# Patient Record
Sex: Male | Born: 1965
Health system: Southern US, Community
[De-identification: ages and names within clinical notes are randomized; demographics above are authoritative.]

## PROBLEM LIST (undated history)

## (undated) DIAGNOSIS — K760 Fatty (change of) liver, not elsewhere classified: Secondary | ICD-10-CM

## (undated) DIAGNOSIS — Z720 Tobacco use: Secondary | ICD-10-CM

## (undated) DIAGNOSIS — F419 Anxiety disorder, unspecified: Secondary | ICD-10-CM

## (undated) DIAGNOSIS — K2970 Gastritis, unspecified, without bleeding: Secondary | ICD-10-CM

## (undated) DIAGNOSIS — M542 Cervicalgia: Secondary | ICD-10-CM

## (undated) DIAGNOSIS — I5189 Other ill-defined heart diseases: Secondary | ICD-10-CM

## (undated) DIAGNOSIS — R002 Palpitations: Secondary | ICD-10-CM

## (undated) DIAGNOSIS — K449 Diaphragmatic hernia without obstruction or gangrene: Secondary | ICD-10-CM

## (undated) DIAGNOSIS — I8390 Asymptomatic varicose veins of unspecified lower extremity: Secondary | ICD-10-CM

## (undated) DIAGNOSIS — R55 Syncope and collapse: Secondary | ICD-10-CM

## (undated) DIAGNOSIS — M5090 Cervical disc disorder, unspecified, unspecified cervical region: Secondary | ICD-10-CM

## (undated) DIAGNOSIS — K219 Gastro-esophageal reflux disease without esophagitis: Secondary | ICD-10-CM

## (undated) DIAGNOSIS — S42009A Fracture of unspecified part of unspecified clavicle, initial encounter for closed fracture: Secondary | ICD-10-CM

## (undated) HISTORY — DX: Fracture of unspecified part of unspecified clavicle, initial encounter for closed fracture: S42.009A

## (undated) HISTORY — DX: Asymptomatic varicose veins of unspecified lower extremity: I83.90

## (undated) HISTORY — PX: OTHER SURGICAL HISTORY: SHX169

## (undated) HISTORY — DX: Syncope and collapse: R55

## (undated) HISTORY — DX: Tobacco use: Z72.0

## (undated) HISTORY — DX: Palpitations: R00.2

## (undated) HISTORY — DX: Diaphragmatic hernia without obstruction or gangrene: K44.9

## (undated) HISTORY — DX: Cervicalgia: M54.2

## (undated) HISTORY — DX: Cervical disc disorder, unspecified, unspecified cervical region: M50.90

## (undated) HISTORY — DX: Anxiety disorder, unspecified: F41.9

## (undated) HISTORY — DX: Other ill-defined heart diseases: I51.89

## (undated) HISTORY — DX: Fatty (change of) liver, not elsewhere classified: K76.0

## (undated) HISTORY — DX: Gastritis, unspecified, without bleeding: K29.70

## (undated) HISTORY — DX: Gastro-esophageal reflux disease without esophagitis: K21.9

---

## 2005-12-05 ENCOUNTER — Encounter: Admission: RE | Admit: 2005-12-05 | Discharge: 2005-12-05 | Payer: Self-pay | Admitting: Neurosurgery

## 2005-12-07 ENCOUNTER — Encounter: Admission: RE | Admit: 2005-12-07 | Discharge: 2005-12-07 | Payer: Self-pay | Admitting: Neurosurgery

## 2006-10-01 ENCOUNTER — Ambulatory Visit: Payer: Self-pay | Admitting: Gastroenterology

## 2006-10-21 ENCOUNTER — Ambulatory Visit: Payer: Self-pay | Admitting: Gastroenterology

## 2006-10-21 ENCOUNTER — Encounter (INDEPENDENT_AMBULATORY_CARE_PROVIDER_SITE_OTHER): Payer: Self-pay | Admitting: Specialist

## 2006-11-05 ENCOUNTER — Ambulatory Visit: Payer: Self-pay | Admitting: Internal Medicine

## 2007-05-20 ENCOUNTER — Ambulatory Visit: Payer: Self-pay | Admitting: Internal Medicine

## 2007-05-20 LAB — CONVERTED CEMR LAB
AST: 17 units/L (ref 0–37)
Bilirubin, Direct: 0.1 mg/dL (ref 0.0–0.3)
CO2: 30 meq/L (ref 19–32)
Chloride: 109 meq/L (ref 96–112)
Creatinine, Ser: 1.2 mg/dL (ref 0.4–1.5)
Eosinophils Relative: 1.7 % (ref 0.0–5.0)
Glucose, Bld: 109 mg/dL — ABNORMAL HIGH (ref 70–99)
HCT: 44.6 % (ref 39.0–52.0)
Ketones, ur: NEGATIVE mg/dL
MCV: 94.9 fL (ref 78.0–100.0)
Neutrophils Relative %: 52.2 % (ref 43.0–77.0)
Nitrite: NEGATIVE
PSA: 1 ng/mL (ref 0.10–4.00)
RBC: 4.7 M/uL (ref 4.22–5.81)
RDW: 12.1 % (ref 11.5–14.6)
Sodium: 143 meq/L (ref 135–145)
Total Bilirubin: 0.6 mg/dL (ref 0.3–1.2)
Total CHOL/HDL Ratio: 5.6
Urobilinogen, UA: 0.2 (ref 0.0–1.0)
WBC: 5 10*3/uL (ref 4.5–10.5)
pH: 6 (ref 5.0–8.0)

## 2007-05-21 ENCOUNTER — Ambulatory Visit: Payer: Self-pay | Admitting: Internal Medicine

## 2007-08-14 ENCOUNTER — Ambulatory Visit: Payer: Self-pay | Admitting: Family Medicine

## 2007-09-08 ENCOUNTER — Ambulatory Visit: Payer: Self-pay | Admitting: Psychology

## 2007-09-17 ENCOUNTER — Ambulatory Visit: Payer: Self-pay | Admitting: Psychology

## 2007-09-22 ENCOUNTER — Telehealth (INDEPENDENT_AMBULATORY_CARE_PROVIDER_SITE_OTHER): Payer: Self-pay | Admitting: *Deleted

## 2007-11-15 DIAGNOSIS — I868 Varicose veins of other specified sites: Secondary | ICD-10-CM | POA: Insufficient documentation

## 2007-11-15 DIAGNOSIS — K219 Gastro-esophageal reflux disease without esophagitis: Secondary | ICD-10-CM | POA: Insufficient documentation

## 2007-11-15 DIAGNOSIS — M5030 Other cervical disc degeneration, unspecified cervical region: Secondary | ICD-10-CM | POA: Insufficient documentation

## 2007-11-15 DIAGNOSIS — F17200 Nicotine dependence, unspecified, uncomplicated: Secondary | ICD-10-CM | POA: Insufficient documentation

## 2007-11-15 DIAGNOSIS — K2270 Barrett's esophagus without dysplasia: Secondary | ICD-10-CM | POA: Insufficient documentation

## 2007-11-16 ENCOUNTER — Ambulatory Visit: Payer: Self-pay | Admitting: Internal Medicine

## 2007-11-16 DIAGNOSIS — J310 Chronic rhinitis: Secondary | ICD-10-CM | POA: Insufficient documentation

## 2007-11-16 DIAGNOSIS — F411 Generalized anxiety disorder: Secondary | ICD-10-CM | POA: Insufficient documentation

## 2007-12-08 ENCOUNTER — Telehealth: Payer: Self-pay | Admitting: Internal Medicine

## 2007-12-08 DIAGNOSIS — M79609 Pain in unspecified limb: Secondary | ICD-10-CM | POA: Insufficient documentation

## 2008-01-12 ENCOUNTER — Ambulatory Visit: Payer: Self-pay | Admitting: Internal Medicine

## 2008-04-24 DIAGNOSIS — S42009A Fracture of unspecified part of unspecified clavicle, initial encounter for closed fracture: Secondary | ICD-10-CM

## 2008-04-24 HISTORY — DX: Fracture of unspecified part of unspecified clavicle, initial encounter for closed fracture: S42.009A

## 2008-04-24 HISTORY — PX: FRACTURE SURGERY: SHX138

## 2008-05-13 ENCOUNTER — Emergency Department (HOSPITAL_BASED_OUTPATIENT_CLINIC_OR_DEPARTMENT_OTHER): Admission: EM | Admit: 2008-05-13 | Discharge: 2008-05-13 | Payer: Self-pay | Admitting: Emergency Medicine

## 2008-05-19 ENCOUNTER — Ambulatory Visit (HOSPITAL_COMMUNITY): Admission: RE | Admit: 2008-05-19 | Discharge: 2008-05-20 | Payer: Self-pay | Admitting: Orthopaedic Surgery

## 2008-08-29 ENCOUNTER — Ambulatory Visit: Payer: Self-pay | Admitting: Internal Medicine

## 2008-08-29 DIAGNOSIS — T8140XA Infection following a procedure, unspecified, initial encounter: Secondary | ICD-10-CM | POA: Insufficient documentation

## 2008-08-29 DIAGNOSIS — M25519 Pain in unspecified shoulder: Secondary | ICD-10-CM | POA: Insufficient documentation

## 2008-08-30 ENCOUNTER — Ambulatory Visit (HOSPITAL_BASED_OUTPATIENT_CLINIC_OR_DEPARTMENT_OTHER): Admission: RE | Admit: 2008-08-30 | Discharge: 2008-08-30 | Payer: Self-pay | Admitting: Internal Medicine

## 2008-08-30 ENCOUNTER — Telehealth: Payer: Self-pay | Admitting: Internal Medicine

## 2008-09-05 ENCOUNTER — Ambulatory Visit (HOSPITAL_COMMUNITY): Admission: RE | Admit: 2008-09-05 | Discharge: 2008-09-06 | Payer: Self-pay | Admitting: Orthopaedic Surgery

## 2008-10-05 ENCOUNTER — Ambulatory Visit (HOSPITAL_BASED_OUTPATIENT_CLINIC_OR_DEPARTMENT_OTHER): Admission: RE | Admit: 2008-10-05 | Discharge: 2008-10-05 | Payer: Self-pay | Admitting: Specialist

## 2008-12-12 ENCOUNTER — Telehealth: Payer: Self-pay | Admitting: Internal Medicine

## 2009-05-23 ENCOUNTER — Telehealth: Payer: Self-pay | Admitting: Internal Medicine

## 2009-09-27 ENCOUNTER — Ambulatory Visit: Payer: Self-pay | Admitting: Internal Medicine

## 2009-09-27 ENCOUNTER — Ambulatory Visit (HOSPITAL_BASED_OUTPATIENT_CLINIC_OR_DEPARTMENT_OTHER): Admission: RE | Admit: 2009-09-27 | Discharge: 2009-09-27 | Payer: Self-pay | Admitting: Internal Medicine

## 2009-09-27 ENCOUNTER — Ambulatory Visit: Payer: Self-pay | Admitting: Diagnostic Radiology

## 2009-09-27 DIAGNOSIS — R059 Cough, unspecified: Secondary | ICD-10-CM | POA: Insufficient documentation

## 2009-09-27 DIAGNOSIS — R05 Cough: Secondary | ICD-10-CM

## 2009-09-28 ENCOUNTER — Telehealth: Payer: Self-pay | Admitting: Internal Medicine

## 2009-10-12 ENCOUNTER — Ambulatory Visit: Payer: Self-pay | Admitting: Radiology

## 2009-10-12 ENCOUNTER — Telehealth: Payer: Self-pay | Admitting: Internal Medicine

## 2009-10-12 ENCOUNTER — Emergency Department (HOSPITAL_BASED_OUTPATIENT_CLINIC_OR_DEPARTMENT_OTHER): Admission: EM | Admit: 2009-10-12 | Discharge: 2009-10-12 | Payer: Self-pay | Admitting: Emergency Medicine

## 2009-10-25 ENCOUNTER — Ambulatory Visit: Payer: Self-pay | Admitting: Internal Medicine

## 2009-10-25 LAB — CONVERTED CEMR LAB
AST: 12 units/L (ref 0–37)
Albumin: 4.5 g/dL (ref 3.5–5.2)
Alkaline Phosphatase: 59 units/L (ref 39–117)
BUN: 20 mg/dL (ref 6–23)
Basophils Relative: 1 % (ref 0–1)
CO2: 26 meq/L (ref 19–32)
Calcium: 9.4 mg/dL (ref 8.4–10.5)
Creatinine, Ser: 1.12 mg/dL (ref 0.40–1.50)
Eosinophils Absolute: 0.1 10*3/uL (ref 0.0–0.7)
HDL: 39 mg/dL — ABNORMAL LOW (ref >39)
Indirect Bilirubin: 0.4 mg/dL (ref 0.0–0.9)
MCHC: 34.8 g/dL (ref 30.0–36.0)
MCV: 92.3 fL (ref 78.0–100.0)
Monocytes Relative: 7 % (ref 3–12)
Neutrophils Relative %: 43 % (ref 43–77)
Platelets: 327 10*3/uL (ref 150–400)
Total Bilirubin: 0.5 mg/dL (ref 0.3–1.2)

## 2009-11-09 ENCOUNTER — Ambulatory Visit: Payer: Self-pay | Admitting: Radiology

## 2009-11-09 ENCOUNTER — Ambulatory Visit: Payer: Self-pay | Admitting: Internal Medicine

## 2009-11-09 ENCOUNTER — Telehealth: Payer: Self-pay | Admitting: Internal Medicine

## 2009-11-09 ENCOUNTER — Ambulatory Visit (HOSPITAL_BASED_OUTPATIENT_CLINIC_OR_DEPARTMENT_OTHER): Admission: RE | Admit: 2009-11-09 | Discharge: 2009-11-09 | Payer: Self-pay | Admitting: Internal Medicine

## 2009-11-09 DIAGNOSIS — G4700 Insomnia, unspecified: Secondary | ICD-10-CM | POA: Insufficient documentation

## 2009-11-09 DIAGNOSIS — R112 Nausea with vomiting, unspecified: Secondary | ICD-10-CM | POA: Insufficient documentation

## 2009-11-09 LAB — CONVERTED CEMR LAB
Albumin: 4.2 g/dL (ref 3.5–5.2)
BUN: 13 mg/dL (ref 6–23)
Basophils Absolute: 0 10*3/uL (ref 0.0–0.1)
Basophils Relative: 1 % (ref 0–1)
CO2: 28 meq/L (ref 19–32)
Calcium: 9.2 mg/dL (ref 8.4–10.5)
Eosinophils Absolute: 0.2 10*3/uL (ref 0.0–0.7)
Eosinophils Relative: 3 % (ref 0–5)
Glucose, Bld: 97 mg/dL (ref 70–99)
HCT: 41.6 % (ref 39.0–52.0)
Hemoglobin: 15.2 g/dL (ref 13.0–17.0)
Lipase: 21 units/L (ref 0–75)
MCHC: 36.5 g/dL — ABNORMAL HIGH (ref 30.0–36.0)
MCV: 88.5 fL (ref 78.0–100.0)
Monocytes Absolute: 0.4 10*3/uL (ref 0.1–1.0)
Monocytes Relative: 7 % (ref 3–12)
RDW: 12.5 % (ref 11.5–15.5)
Total Bilirubin: 0.5 mg/dL (ref 0.3–1.2)
Total Protein: 6.7 g/dL (ref 6.0–8.3)

## 2009-11-12 ENCOUNTER — Telehealth: Payer: Self-pay | Admitting: Internal Medicine

## 2009-11-20 ENCOUNTER — Telehealth: Payer: Self-pay | Admitting: Internal Medicine

## 2009-11-26 ENCOUNTER — Telehealth: Payer: Self-pay | Admitting: Internal Medicine

## 2009-11-27 ENCOUNTER — Encounter (INDEPENDENT_AMBULATORY_CARE_PROVIDER_SITE_OTHER): Payer: Self-pay | Admitting: *Deleted

## 2009-12-11 ENCOUNTER — Telehealth: Payer: Self-pay | Admitting: Internal Medicine

## 2009-12-11 DIAGNOSIS — J329 Chronic sinusitis, unspecified: Secondary | ICD-10-CM | POA: Insufficient documentation

## 2009-12-12 ENCOUNTER — Encounter (INDEPENDENT_AMBULATORY_CARE_PROVIDER_SITE_OTHER): Payer: Self-pay | Admitting: *Deleted

## 2010-02-21 ENCOUNTER — Telehealth: Payer: Self-pay | Admitting: Internal Medicine

## 2010-03-14 ENCOUNTER — Telehealth: Payer: Self-pay | Admitting: Internal Medicine

## 2010-03-25 ENCOUNTER — Ambulatory Visit: Payer: Self-pay | Admitting: Gastroenterology

## 2010-04-16 ENCOUNTER — Encounter (INDEPENDENT_AMBULATORY_CARE_PROVIDER_SITE_OTHER): Payer: Self-pay | Admitting: *Deleted

## 2010-05-29 ENCOUNTER — Ambulatory Visit: Payer: Self-pay | Admitting: Family

## 2010-06-04 ENCOUNTER — Ambulatory Visit: Payer: Self-pay | Admitting: Internal Medicine

## 2010-06-05 ENCOUNTER — Ambulatory Visit: Payer: Self-pay | Admitting: Gastroenterology

## 2010-06-05 LAB — HM COLONOSCOPY

## 2010-06-13 ENCOUNTER — Telehealth: Payer: Self-pay | Admitting: Internal Medicine

## 2010-07-30 ENCOUNTER — Telehealth: Payer: Self-pay | Admitting: Internal Medicine

## 2010-10-16 ENCOUNTER — Ambulatory Visit: Payer: Self-pay | Admitting: Internal Medicine

## 2010-10-16 DIAGNOSIS — L0201 Cutaneous abscess of face: Secondary | ICD-10-CM | POA: Insufficient documentation

## 2010-10-16 DIAGNOSIS — L03211 Cellulitis of face: Secondary | ICD-10-CM | POA: Insufficient documentation

## 2010-11-15 ENCOUNTER — Ambulatory Visit: Payer: Self-pay | Admitting: Internal Medicine

## 2010-11-20 ENCOUNTER — Ambulatory Visit (HOSPITAL_BASED_OUTPATIENT_CLINIC_OR_DEPARTMENT_OTHER)
Admission: RE | Admit: 2010-11-20 | Discharge: 2010-11-20 | Payer: Self-pay | Source: Home / Self Care | Attending: Internal Medicine | Admitting: Internal Medicine

## 2010-11-20 ENCOUNTER — Ambulatory Visit
Admission: RE | Admit: 2010-11-20 | Discharge: 2010-11-20 | Payer: Self-pay | Source: Home / Self Care | Attending: Family | Admitting: Family

## 2010-12-07 ENCOUNTER — Emergency Department (HOSPITAL_BASED_OUTPATIENT_CLINIC_OR_DEPARTMENT_OTHER)
Admission: EM | Admit: 2010-12-07 | Discharge: 2010-12-07 | Payer: Self-pay | Source: Home / Self Care | Admitting: Emergency Medicine

## 2010-12-09 ENCOUNTER — Ambulatory Visit (HOSPITAL_BASED_OUTPATIENT_CLINIC_OR_DEPARTMENT_OTHER)
Admission: RE | Admit: 2010-12-09 | Discharge: 2010-12-09 | Payer: Self-pay | Source: Home / Self Care | Attending: Internal Medicine | Admitting: Internal Medicine

## 2010-12-09 ENCOUNTER — Telehealth: Payer: Self-pay | Admitting: Internal Medicine

## 2010-12-09 ENCOUNTER — Encounter: Payer: Self-pay | Admitting: Internal Medicine

## 2010-12-09 ENCOUNTER — Ambulatory Visit
Admission: RE | Admit: 2010-12-09 | Discharge: 2010-12-09 | Payer: Self-pay | Source: Home / Self Care | Attending: Internal Medicine | Admitting: Internal Medicine

## 2010-12-09 DIAGNOSIS — R079 Chest pain, unspecified: Secondary | ICD-10-CM | POA: Insufficient documentation

## 2010-12-09 DIAGNOSIS — I10 Essential (primary) hypertension: Secondary | ICD-10-CM | POA: Insufficient documentation

## 2010-12-09 LAB — DIFFERENTIAL
Basophils Absolute: 0 10*3/uL (ref 0.0–0.1)
Basophils Relative: 0 % (ref 0–1)
Eosinophils Absolute: 0.1 10*3/uL (ref 0.0–0.7)
Eosinophils Relative: 2 % (ref 0–5)
Lymphocytes Relative: 39 % (ref 12–46)
Lymphs Abs: 2.2 10*3/uL (ref 0.7–4.0)
Monocytes Absolute: 0.4 10*3/uL (ref 0.1–1.0)
Monocytes Relative: 7 % (ref 3–12)
Neutro Abs: 2.9 10*3/uL (ref 1.7–7.7)
Neutrophils Relative %: 52 % (ref 43–77)

## 2010-12-09 LAB — BASIC METABOLIC PANEL
BUN: 18 mg/dL (ref 6–23)
CO2: 23 mEq/L (ref 19–32)
Calcium: 9.3 mg/dL (ref 8.4–10.5)
Chloride: 110 mEq/L (ref 96–112)
Creatinine, Ser: 1 mg/dL (ref 0.4–1.5)
GFR calc Af Amer: >60 mL/min (ref >60)
GFR calc non Af Amer: >60 mL/min (ref >60)
Glucose, Bld: 145 mg/dL — ABNORMAL HIGH (ref 70–99)
Potassium: 3.9 mEq/L (ref 3.5–5.1)
Sodium: 146 mEq/L — ABNORMAL HIGH (ref 135–145)

## 2010-12-09 LAB — CBC
HCT: 40.5 % (ref 39.0–52.0)
Hemoglobin: 14.9 g/dL (ref 13.0–17.0)
MCH: 32.5 pg (ref 26.0–34.0)
MCHC: 36.8 g/dL — ABNORMAL HIGH (ref 30.0–36.0)
MCV: 88.2 fL (ref 78.0–100.0)
Platelets: 284 10*3/uL (ref 150–400)
RBC: 4.59 MIL/uL (ref 4.22–5.81)
RDW: 12.9 % (ref 11.5–15.5)
WBC: 5.7 10*3/uL (ref 4.0–10.5)

## 2010-12-09 LAB — URINALYSIS, ROUTINE W REFLEX MICROSCOPIC
Bilirubin Urine: NEGATIVE
Hgb urine dipstick: NEGATIVE
Ketones, ur: NEGATIVE mg/dL
Nitrite: NEGATIVE
Protein, ur: NEGATIVE mg/dL
Specific Gravity, Urine: 1.023 (ref 1.005–1.030)
Urine Glucose, Fasting: NEGATIVE mg/dL
Urobilinogen, UA: 1 mg/dL (ref 0.0–1.0)
pH: 6.5 (ref 5.0–8.0)

## 2010-12-09 LAB — POCT CARDIAC MARKERS
CKMB, poc: <1 ng/mL — ABNORMAL LOW (ref 1.0–8.0)
CKMB, poc: <1 ng/mL — ABNORMAL LOW (ref 1.0–8.0)
Myoglobin, poc: 41.8 ng/mL (ref 12–200)
Myoglobin, poc: 33.8 ng/mL (ref 12–200)
Troponin i, poc: <0.05 ng/mL (ref 0.00–0.09)
Troponin i, poc: <0.05 ng/mL (ref 0.00–0.09)

## 2010-12-09 LAB — POCT TOXICOLOGY PANEL: Tetrahydrocannabinol: POSITIVE

## 2010-12-09 LAB — HEPATIC FUNCTION PANEL
ALT: 31 U/L (ref 0–53)
AST: 40 U/L — ABNORMAL HIGH (ref 0–37)
Albumin: 4.1 g/dL (ref 3.5–5.2)
Alkaline Phosphatase: 80 U/L (ref 39–117)
Bilirubin, Direct: 0 mg/dL (ref 0.0–0.3)
Indirect Bilirubin: 0.8 mg/dL (ref 0.3–0.9)
Total Bilirubin: 0.8 mg/dL (ref 0.3–1.2)
Total Protein: 7.1 g/dL (ref 6.0–8.3)

## 2010-12-09 LAB — LIPASE, BLOOD: Lipase: 100 U/L (ref 23–300)

## 2010-12-10 ENCOUNTER — Telehealth: Payer: Self-pay | Admitting: Internal Medicine

## 2010-12-11 ENCOUNTER — Ambulatory Visit
Admission: RE | Admit: 2010-12-11 | Discharge: 2010-12-11 | Payer: Self-pay | Source: Home / Self Care | Attending: Cardiology | Admitting: Cardiology

## 2010-12-11 DIAGNOSIS — R002 Palpitations: Secondary | ICD-10-CM | POA: Insufficient documentation

## 2010-12-12 ENCOUNTER — Telehealth: Payer: Self-pay | Admitting: Internal Medicine

## 2010-12-13 ENCOUNTER — Ambulatory Visit
Admission: RE | Admit: 2010-12-13 | Discharge: 2010-12-13 | Payer: Self-pay | Source: Home / Self Care | Attending: Internal Medicine | Admitting: Internal Medicine

## 2010-12-17 ENCOUNTER — Ambulatory Visit: Admission: RE | Admit: 2010-12-17 | Discharge: 2010-12-17 | Payer: Self-pay | Source: Home / Self Care

## 2010-12-17 ENCOUNTER — Ambulatory Visit (HOSPITAL_COMMUNITY)
Admission: RE | Admit: 2010-12-17 | Discharge: 2010-12-17 | Payer: Self-pay | Source: Home / Self Care | Attending: Cardiology | Admitting: Cardiology

## 2010-12-17 ENCOUNTER — Encounter: Payer: Self-pay | Admitting: Cardiology

## 2010-12-17 DIAGNOSIS — I5189 Other ill-defined heart diseases: Secondary | ICD-10-CM

## 2010-12-17 HISTORY — DX: Other ill-defined heart diseases: I51.89

## 2010-12-20 ENCOUNTER — Telehealth: Payer: Self-pay | Admitting: Internal Medicine

## 2010-12-24 ENCOUNTER — Ambulatory Visit
Admission: RE | Admit: 2010-12-24 | Discharge: 2010-12-24 | Payer: Self-pay | Source: Home / Self Care | Attending: Gastroenterology | Admitting: Gastroenterology

## 2010-12-24 ENCOUNTER — Encounter: Payer: Self-pay | Admitting: Gastroenterology

## 2010-12-24 DIAGNOSIS — R1319 Other dysphagia: Secondary | ICD-10-CM | POA: Insufficient documentation

## 2010-12-24 NOTE — Assessment & Plan Note (Signed)
Summary: mrsa?/mhf   Vital Signs:  Patient profile:   45 year old male Height:      75.5 inches Weight:      256.50 pounds BMI:     31.75 O2 Sat:      98 % on Room air Temp:     97.7 degrees F oral Resp:     18 per minute BP sitting:   122 / 90  (left arm) Cuff size:   large  Vitals Entered By: Glendell Docker CMA (October 16, 2010 9:06 AM)  O2 Flow:  Room air CC: Facial Pain Is Patient Diabetic? No   Primary Care Provider:  Thomos Lemons, DO  CC:  Facial Pain.  History of Present Illness: Raymond Mathews, Raymond Mathews  Preventive Screening-Counseling & Management  Alcohol-Tobacco     Smoking Status: quit  Allergies (verified): No Known Drug Allergies  Past History:  Past Medical History: GERD History of neck pain -  History of tobacco abuse    Hx of accident 06/09 - left comminuted clavicle fracture (complicated by wound infection)  Cervical Disc Disease Varicose Veins Hiatal Hernia Gastritis  Past Surgical History: Open reduction internal fixation of left clavicle fracture using Katrinka Blazing Raymond Matthew anatomic clavicle plate.  -  30/8657    Family History: Mother is 76 years old with uterine cancer or thyroid disorder Father deceased at age Raymond secondary to his stroke Grandmother with diabetes     Family History of Diabetes: Grandparents Family History of Heart Disease: Grandparents     Social History: Single  has two boys Occupation:  Self-employed in Chief Operating Officer Former Smoker quit in 2001 Alcohol use-no  Drug use-no  Poor diet - fast food  Daily Caffeine Use-coffee    Physical Exam  General:  alert, well-developed, Raymond well-nourished.   Mathews:  left nares red Raymond swollen,  no pus,  no fluctuance   Impression & Recommendations:  Problem # 1:  CELLULITIS Raymond ABSCESS OF FACE (ICD-682.0)  The  following medications were removed from the medication list:    Smz-tmp Ds 800-160 Mg Tabs (Sulfamethoxazole-trimethoprim) ..... One by mouth two times a day    Clindamycin Hcl 150 Mg Caps (Clindamycin hcl) ..... One by mouth three times a day His updated medication list for this problem includes:    Sulfamethoxazole-tmp Ds 800-160 Mg Tabs (Sulfamethoxazole-trimethoprim) ..... One by mouth two times a day    Clindamycin Hcl 150 Mg Caps (Clindamycin hcl) ..... One by mouth three times a day   Warm moist compresses for 20 minutes every 2 hours while awake. Take antibiotics as directed Raymond take acetaminophen as needed. To be seen in 48-72 hours if no improvement, sooner if worse.  Complete Medication List: 1)  Prevacid 24hr 15 Mg Cpdr (Lansoprazole) .... Take 1 capsule by mouth once a day 2)  Sulfamethoxazole-tmp Ds 800-160 Mg Tabs (Sulfamethoxazole-trimethoprim) .... One by mouth two times a day 3)  Clindamycin Hcl 150 Mg Caps (Clindamycin hcl) .... One by mouth three times a day  Patient Instructions: 1)  Call our office if your symptoms do not  improve or gets worse.  Contraindications/Deferment of Procedures/Staging:    Test/Procedure: FLU VAX    Reason for deferment: patient declined  Prescriptions: CLINDAMYCIN HCL 150 MG CAPS (CLINDAMYCIN HCL) one by mouth three times a  day  #30 x 0   Entered Raymond Authorized by:   D. Thomos Lemons DO   Signed by:   D. Thomos Lemons DO on 10/16/2010   Method used:   [id number]       [address]       [place], [place]  [address]       Ph: [phone] or [phone]       Fax: [phone]   RxID:   [id number] SULFAMETHOXAZOLE-TMP DS 800-160 MG TABS (SULFAMETHOXAZOLE-TRIMETHOPRIM) one by mouth two times a day  #20 x 0   Entered Raymond Authorized by:   D. Thomos Lemons DO   Signed by:   D. Thomos Lemons DO on 10/16/2010   Method used:   [facility]        [id number]       [address]       [place], [place]  [address]       Ph: [phone] or [phone]       Fax: [phone]   RxID:   [id number]    Orders Added: 1)  Est. Patient Level III [[address]]   Immunization History:  Influenza Immunization History:    Influenza:  declined (10/16/2010)   Immunization History:  Influenza Immunization History:    Influenza:  Declined (10/16/2010)  Current Allergies (reviewed today): No known allergies

## 2010-12-24 NOTE — Assessment & Plan Note (Signed)
Summary: lump on neck/mhf   Vital Signs:  Patient profile:   45 year old male Height:      75.5 inches Weight:      243.50 pounds BMI:     30.14 O2 Sat:      98 % on Room air Temp:     97.8 degrees F oral Pulse rate:   99 / minute Pulse rhythm:   regular Resp:     16 per minute BP sitting:   110 / 80  (right arm) Cuff size:   large  Vitals Entered By: Glendell Docker CMA (May 29, 2010 1:39 PM)  O2 Flow:  Room air CC: Rm- 4 Evaluation of lump on neck Comments onset Sunday of of bump on left, after he attempted to squeeze it , it became bigger. He states that it hurts constantly and he has pain in his shoulder area   Primary Care Provider:  D. Robert Yoo DO  CC:  Rm- 4 Evaluation of lump on neck.  History of Present Illness: Raymond Mathews is a 45 year old male who presents with complaint of "lump on my neck."  He notes that this area initially started as a pimple which he "picked at."  Now has become red, swollen, and painful.  He has tried ice and cold compresses which have not improved his symptoms.  Wife is currently being treated for a cellulitis of her finger.  He notes that he has been doing some yard work recently.  Allergies: No Known Drug Allergies  Physical Exam  General:  Well-developed,well-nourished,in no acute distress; alert,appropriate and cooperative throughout examination Head:  Normocephalic and atraumatic without obvious abnormalities. No apparent alopecia or balding. Neck:  approximately 1inch wide area of induration noted on base of left neck with scabbed center- no drainage.  Tender to touch with some extension of erythema beneath the area of induration   Impression & Recommendations:  Problem # 1:  CELLULITIS AND ABSCESS OF NECK (ICD-682.1) Assessment New Will plan to treat with doxycycline.  No fluctuance noted- do not suspect that I and D would yield much drainage at this point.  Plan to have patient f/u in 48 hours- sooner if increased pain, redness,  swelling- patient verbalizes understanding. His updated medication list for this problem includes:    Doxycycline Hyclate 100 Mg Caps (Doxycycline hyclate) ..... One tablet by mouth two times a day x 10 days  Complete Medication List: 1)  Omeprazole-sodium Bicarbonate 40-1100 Mg Caps (Omeprazole-sodium bicarbonate) .... Take 1 capsule by mouth once a day (office visit needed for future refills) 2)  Doxycycline Hyclate 100 Mg Caps (Doxycycline hyclate) .... One tablet by mouth two times a day x 10 days  Patient Instructions: 1)  Call if you develop increased pain,  swelling of neck. 2)  Follow up in Friday. Prescriptions: DOXYCYCLINE HYCLATE 100 MG CAPS (DOXYCYCLINE HYCLATE) one tablet by mouth two times a day x 10 days  #20 x 0   Entered and Authorized by:   Fallon Haecker S O\'Sullivan FNP   Signed by:   Raysha Tilmon S O\'Sullivan FNP on 05/29/2010   Method used:   [facility]        [id number]       [address]       [place], [place]  [address]       Ph: [phone] or [phone]       Fax: [phone]  RxID:   [id number]

## 2010-12-24 NOTE — Letter (Signed)
Summary: New Patient letter  [facility]  [address] [place], [place] [address]   Phone: [phone]  Fax: [phone]       11/27/2009 MRN: [id number]  Raymond Mathews [address] [place], [place]  [address]  Dear Mr. Alberty,  Welcome to the Gastroenterology Division at [facility].    You are scheduled to see Dr. Russella Dar on 12-24-09 at 10:00a.m. on the 3rd floor at [facility], [address].  We ask that you try to arrive at our office 15 minutes prior to your appointment time to allow for check-in.  We would like you to complete the enclosed self-administered evaluation form prior to your visit and bring it with you on the day of your appointment.  We will review it with you.  Also, please bring a complete list of all your medications or, if you prefer, bring the medication bottles and we will list them.  Please bring your insurance card so that we may make a copy of it.  If your insurance requires a referral to see a specialist, please bring your referral form from your primary care physician.  Co-payments are due at the time of your visit and may be paid by cash, check or credit card.     Your office visit will consist of a consult with your physician (includes a physical exam), any laboratory testing he/she may order, scheduling of any necessary diagnostic testing (e.g. x-ray, ultrasound, CT-scan), and scheduling of a procedure (e.g. Endoscopy, Colonoscopy) if required.  Please allow enough time on your schedule to allow for any/all of these possibilities.    If you cannot keep your appointment, please call [phone] to cancel or reschedule prior to your appointment date.  This allows [place] the opportunity to schedule an appointment for another patient in need of care.  If you do not cancel or reschedule by 5 p.m. the business day prior to your appointment date, you will be charged a $50.00 late cancellation/no-show fee.    Thank you for choosing  [facility] for your medical needs.  We appreciate the opportunity to care for you.  Please visit [place] at our website  to learn more about our practice.                     Sincerely,                                                             The Gastroenterology Division

## 2010-12-24 NOTE — Assessment & Plan Note (Signed)
Summary: vomiting/nauseated--ch.   History of Present Illness Visit Type: consult Primary GI MD: Elie Goody MD [facility] Primary Provider: Thomos Lemons, DO Requesting Provider:  Thomos Lemons, DO Chief Complaint: GERD, pt no longer has nausea and vomiting History of Present Illness:   Patient is a 45 year old male last seen by Dr. Russella Dar in 2007 for history of GERD.  PCP recently gave him a prescription for Nexium but prior to that he had been taking daily Prevacid OTC with suboptimal control of GERD symptoms. Feels some of the ongoing heartburn / regurgitation is his fault because of only partial compliance with anti-reflux measures. No dysphagia. No other GI complaints.   On Clindamycin for neck cellulitis. No diarrhea.    GI Review of Systems    Reports acid reflux, belching, bloating, chest pain, and  heartburn.      Denies abdominal pain, dysphagia with liquids, dysphagia with solids, loss of appetite, nausea, vomiting, vomiting blood, weight loss, and  weight gain.        Denies anal fissure, black tarry stools, change in bowel habit, constipation, diarrhea, diverticulosis, fecal incontinence, heme positive stool, hemorrhoids, irritable bowel syndrome, jaundice, light color stool, liver problems, rectal bleeding, and  rectal pain.   Current Medications (verified): 1)  Smz-Tmp Ds 800-160 Mg Tabs (Sulfamethoxazole-Trimethoprim) .... One By Mouth Two Times A Day 2)  Clindamycin Hcl 150 Mg Caps (Clindamycin Hcl) .... One By Mouth Three Times A Day 3)  Prevacid 15 Mg Cpdr (Lansoprazole) .... One Tablet By Mouth Once Daily  Allergies (verified): No Known Drug Allergies  Past History:  Past Medical History: GERD History of neck pain -  History of tobacco abuse   Hx of accident 06/09 - left comminuted clavicle fracture (complicated by wound infection)  Cervical Disc Disease Varicose Veins Hiatal Hernia Gastritis  Past Surgical History: Open reduction internal fixation of left  clavicle fracture using Katrinka Blazing and Matthew anatomic clavicle plate.  -  16/1096   Family History: Reviewed history from 12/20/2009 and no changes required. Mother is 29 years old with uterine cancer or thyroid disorder Father deceased at age 75 secondary to his stroke Grandmother with diabetes     Family History of Diabetes: Grandparents Family History of Heart Disease: Grandparents  Social History: Single  has two boys Occupation:  Self-employed in Chief Operating Officer Former Smoker quit in 2001 Alcohol use-no  Drug use-no  Poor diet - fast food  Daily Caffeine Use-coffee  Review of Systems       The patient complains of anxiety-new.  The patient denies allergy/sinus, anemia, arthritis/joint pain, back pain, blood in urine, breast changes/lumps, change in vision, confusion, cough, coughing up blood, depression-new, fainting, fatigue, fever, headaches-new, hearing problems, heart murmur, heart rhythm changes, itching, menstrual pain, muscle pains/cramps, night sweats, nosebleeds, pregnancy symptoms, shortness of breath, skin rash, sleeping problems, sore throat, swelling of feet/legs, swollen lymph glands, thirst - excessive , urination - excessive , urination changes/pain, urine leakage, vision changes, and voice change.    Vital Signs:  Patient profile:   45 year old male Height:      75.5 inches Weight:      246 pounds BMI:     30.45 Pulse rate:   80 / minute Pulse rhythm:   regular BP sitting:   118 / 78  (left arm) Cuff size:   large  Vitals Entered By: Francee Piccolo CMA Duncan Dull) (June 05, 2010 9:29 AM)  Physical Exam  General:  Well developed, well nourished, no  acute distress. Head:  Normocephalic and atraumatic. Eyes:  PERRLA, no icterus. Ears:  Normal auditory acuity. Lungs:  Clear throughout to auscultation. Heart:  Regular rate and rhythm; no murmurs, rubs,  or bruits. Abdomen:  Abdomen soft, nontender, nondistended. No obvious masses or hepatomegaly.Normal  bowel sounds.  Msk:  Symmetrical with no gross deformities. Normal posture. Neurologic:  Alert and  oriented x4;  grossly normal neurologically. Psych:  Alert and cooperative. Normal mood and affect.  Impression & Recommendations:  Problem # 1:  GASTROESOPHAGEAL REFLUX DISEASE (ICD-530.81) Suboptimal control of GERD symptoms (heartburn, regurgitation) on Prevacid OTC. Back on Nexium, needs refill. Reiterated anti-reflux measures. Irregular z-line on EGD in 2007 - biopsies were negative for Barretts.   Problem # 2:  SCREENING COLORECTAL-CANCER (ICD-V76.51) Screening colonoscopy at age 40 in 03/2017.  Patient Instructions: 1)  Discontinue Prevacid OTC and a refill for Nexium has been sent to your pharmacy.  2)  Avoid foods high in acid content ( tomatoes, citrus juices, spicy foods) . Avoid eating within 3 to 4 hours of lying down or before exercising. Do not over eat; try smaller more frequent meals. Elevate head of bed four inches when sleeping.  3)  Please schedule a follow-up appointment in 1 year. 4)  Copy sent to : Thomos Lemons, DO 5)  The medication list was reviewed and reconciled.  All changed / newly prescribed medications were explained.  A complete medication list was provided to the patient / caregiver.  Prescriptions: NEXIUM 40 MG CPDR (ESOMEPRAZOLE MAGNESIUM) one tablet by mouth once daily  #30 x 11   Entered by:   Christie Nottingham CMA (AAMA)   Authorized by:   Meryl Dare MD [facility]   Signed by:   Christie Nottingham CMA (AAMA) on 06/05/2010   Method used:   [facility]        [phone]       [address]       [place], [place]  [address]       Ph: [phone] or [phone]       Fax: [phone]   RxID:   [id number]

## 2010-12-24 NOTE — Assessment & Plan Note (Signed)
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 Thomos Lemons DO  CC:  Rm - 2  Evaluation of lump on neck.  History of Present Illness: 45 y/o  white male for follow up: pt prev seen by Sandford Craze  " who presents with complaint of "lump on my neck."  He notes that this area initially started as a pimple which he "picked at."  Now has become red, swollen, and painful.  He has tried ice and cold compresses which have not improved his symptoms.  Wife is currently being treated for a cellulitis of her finger.  He notes that he has been doing some yard work recently."  area has been draining pus.  better since using doxycycline but not completely resolved. no fever or chills  Allergies (verified): No Known Drug Allergies  Past History:  Past Medical History: GERD History of neck pain -  History of tobacco abuse   Hx of accident 06/09 - left comminuted clav icle fracture (complicated by wound infection)  Cervical Disc Disease Varicose Veins Hiatal Hernia Gastritis  Family History: Mother is 78 years old with uterine cancer or thyroid disorder Father deceased at age 22 secondary to his stroke Grandmother with diabetes     Family History of Diabetes: Grandparents Family History of Heart Disease: Grandparents    Social History: Single  has two children Occupation:  Self-employed in Insurance underwriter Former Smoker quit in 2001  Alcohol use-no  Drug use-no  Poor diet - fast food   Physical Exam  General:  alert, well-developed, and well-nourished.   Neck:  2x 4 cm oblong area of inderation left lower neck.  minimal drainage.  mild tenderness.  mild redness Lungs:  normal respiratory effort and normal breath sounds.   Heart:  normal rate, regular rhythm, and no gallop.     Impression & Recommendations:  Problem # 1:  CELLULITIS AND ABSCESS OF NECK (ICD-682.1) Assessment Improved  improved but not resolved.  after obtaining concent.  area prepped with betadyne.  small 1cm incision made for I & D.  small amt of pus drained.  switch abx to combination of bactrim and clinda.  Patient advised to call office if symptoms persist or worsen.  The following medications were removed from the medication list:    Doxycycline Hyclate 100 Mg Caps (Doxycycline hyclate) ..... One tablet by mouth two times a day x 10 days His updated medication list for this problem includes:    Smz-tmp Ds 800-160 Mg Tabs (Sulfamethoxazole-trimethoprim) ..... One by mouth two times a day    Clindamycin Hcl 150 Mg Caps (Clindamycin hcl) ..... One by mouth three times a day  Orders: I&D  Abscess, Simple / Single ([address])  Complete Medication List: 1)  Smz-tmp Ds 800-160 Mg Tabs (Sulfamethoxazole-trimethoprim) .... One by mouth two times a day 2)  Clindamycin Hcl 150 Mg Caps (Clindamycin hcl) .... One by mouth three times a day 3)  Nexium 40 Mg Cpdr (Esomeprazole magnesium) .... One tablet by mouth once daily  Patient Instructions: 1)  Call our office if your symptoms do not  improve or gets worse. 2)  The highlighted prescriptions were electronically sent to your pharmacy Prescriptions: CLINDAMYCIN HCL 150 MG CAPS (CLINDAMYCIN HCL) one by mouth three times a day  #30 x 0   Entered and Authorized by:   D. Thomos Lemons DO   Signed by:   D. Thomos Lemons DO on 06/04/2010   Method used:   [facility]         [id number]       [address]       [place], [place]  [address]       Ph: [phone] or [phone]       Fax: [phone]   RxID:   [id number] SMZ-TMP DS 800-160 MG TABS (SULFAMETHOXAZOLE-TRIMETHOPRIM) one by mouth two times a day  #20 x 0   Entered and Authorized by:   D. Thomos Lemons DO   Signed by:   D. Thomos Lemons DO on 06/04/2010   Method used:   [facility]        [id number]       [address]       [place], [place]  [address]       Ph: [phone] or [phone]       Fax: [phone]   RxID:   [id number]   Current Allergies (reviewed today): No known allergies

## 2010-12-24 NOTE — Progress Notes (Signed)
Summary: Status Update  ---- Converted from flag ---- ---- 06/11/2010 5:43 PM, D. Thomos Lemons DO wrote: call pt - is he still having issues with nausea and vomiting ------------------------------  Phone Note Outgoing Call   Call placed by: Glendell Docker CMA,  June 13, 2010 11:51 AM Call placed to: Patient Summary of Call: call placed to patient at [phone], no answer. A detailed voice message was left for patient to return call regarding his nausea and vomiting Initial call taken by: Glendell Docker CMA,  June 13, 2010 11:53 AM  Follow-up for Phone Call        call placed to patient at [phone], no answer. A detailed voice message left for patient to return call regarding nausea and vomiting. Follow-up by: Glendell Docker CMA,  June 14, 2010 10:14 AM  Additional Follow-up for Phone Call Additional follow up Details #1::        Pt states that nausea is better. Saw Dr. Russella Dar yesterday and was placed back on Nexium which seems to be helping.  Nicki Guadalajara Fergerson CMA Duncan Dull)  June 14, 2010 10:19 AM

## 2010-12-24 NOTE — Letter (Signed)
Summary: New Patient letter  [facility]  [address] [place], [place] [address]   Phone: [phone]  Fax: [phone]       04/16/2010 MRN: [id number]  Raymond Mathews [address] [place], [place]  [address]  Dear Mr. Raymond Mathews,  Welcome to the Gastroenterology Division at [facility].    You are scheduled to see Dr.  Russella Dar on 06-05-10 at 9:30a.m. on the 3rd floor at [facility], [address].  We ask that you try to arrive at our office 15 minutes prior to your appointment time to allow for check-in.  We would like you to complete the enclosed self-administered evaluation form prior to your visit and bring it with you on the day of your appointment.  We will review it with you.  Also, please bring a complete list of all your medications or, if you prefer, bring the medication bottles and we will list them.  Please bring your insurance card so that we may make a copy of it.  If your insurance requires a referral to see a specialist, please bring your referral form from your primary care physician.  Co-payments are due at the time of your visit and may be paid by cash, check or credit card.     Your office visit will consist of a consult with your physician (includes a physical exam), any laboratory testing he/she may order, scheduling of any necessary diagnostic testing (e.g. x-ray, ultrasound, CT-scan), and scheduling of a procedure (e.g. Endoscopy, Colonoscopy) if required.  Please allow enough time on your schedule to allow for any/all of these possibilities.    If you cannot keep your appointment, please call [phone] to cancel or reschedule prior to your appointment date.  This allows [place] the opportunity to schedule an appointment for another patient in need of care.  If you do not cancel or reschedule by 5 p.m. the business day prior to your appointment date, you will be charged a $50.00 late cancellation/no-show fee.    Thank you for choosing  [facility] for your medical needs.  We appreciate the opportunity to care for you.  Please visit [place] at our website  to learn more about our practice.                     Sincerely,                                                             The Gastroenterology Division

## 2010-12-24 NOTE — Progress Notes (Signed)
Summary: THROWING UP AGAIN   Phone Note Call from Patient Call back at Home Phone [phone]   Caller: PT LIVE Call For: yOO  Summary of Call: MEDICATION CHANGED FROM NEXIUM TO SOMETHING ELSE.  hE ATE A [facility] FROM [place] AND HAS BEEN THROWING UP SINCE.  HE STILL FEELS NAUSEATED.  HE HAS BEEN FINE UNTIL TODAY AND HAS BEEN ABLE TO EAT TILL TODAY.  Initial call taken by: Roselle Locus,  November 26, 2009 1:26 PM  Follow-up for Phone Call        I suggest referral to GI Follow-up by: D. Thomos Lemons DO,  November 26, 2009 1:53 PM  Additional Follow-up for Phone Call Additional follow up Details #1::        Appt  Dr  Russella Dar   Jan 31  pt will be put on cancellation list for a earlier appt if made available Additional Follow-up by: Darral Dash,  November 28, 2009 8:29 AM

## 2010-12-24 NOTE — Progress Notes (Signed)
Summary: Rx   Phone Note Call from Patient Call back at Home Phone [phone]   Caller: Patient Reason for Call: Refill Medication Summary of Call: Advised pt to schedule an OV, pt is out of town & will CB to schedule appt, pt stated he was not contacted to make an appt, wished he had been notified Initial call taken by: Lannette Donath,  March 14, 2010 3:05 PM

## 2010-12-24 NOTE — Letter (Signed)
Summary: Primary Care Consult Scheduled Letter  [place] [facility]  [address]   [place], [place] [address]   Phone: [phone]  Fax: [phone]      12/12/2009 MRN: [id number]  Raymond Mathews [address] [place], [place]  [address]    Dear Mr. Tullis,      We have scheduled an appointment for you.  At the recommendation of Dr.Yoo, we have scheduled you a consult with [facility] ENT , Dr Lazarus Salines  on _February 15 at [facility].  Their address [address]  [address],  [place] The office phone number is [phone].  If this appointment day and time is not convenient for you, please feel free to call the office of the doctor you are being referred to at the number listed above and reschedule the appointment.     It is important for you to keep your scheduled appointments. We are here to make sure you are given good patient care. If you have questions or you have made changes to your appointment, please notify us at  [phone], ask for [facility].    Thank you,  Patient Care Coordinator [place] [facility]

## 2010-12-24 NOTE — Progress Notes (Signed)
Summary: ENT referral Request  Phone Note Call from Patient   Caller: Patient Call For: D. Thomos Lemons DO Reason for Call: Talk to Doctor Summary of Call: Is having pressure again behind his Left eye and in cheek. Would like a referral to ENT. Please call pt [phone] and let him know the status  on this. 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

## 2010-12-24 NOTE — Procedures (Signed)
Summary: EGD   EGD  Procedure date:  10/21/2006  Findings:      Location: [facility]   Patient Name: Raymond Mathews, Raymond Mathews MRN:  Procedure Procedures: Panendoscopy (EGD) CPT: 43235.    with biopsy(s)/brushing(s). CPT: D1846139.  Personnel: Endoscopist: Venita Lick. Russella Dar, MD, Clementeen Graham.  Exam Location: Exam performed in Outpatient Clinic. Outpatient  Patient Consent: Procedure, Alternatives, Risks and Benefits discussed, consent obtained, from patient. Consent was obtained by the RN.  Indications Symptoms: Chest Pain. Reflux symptoms  History  Current Medications: Patient is not currently taking Coumadin.  Pre-Exam Physical: Performed Oct 21, 2006  Cardio-pulmonary exam, HEENT exam, Abdominal exam, Mental status exam WNL.  Comments: Pt. history reviewed/updated, physical exam performed prior to initiation of sedation?Yes Exam Exam Info: Maximum depth of insertion Duodenum, intended Duodenum. Vocal cords not visualized. Gastric retroflexion performed. ASA Classification: II. Tolerance: excellent.  Sedation Meds: Patient assessed and found to be appropriate for moderate (conscious) sedation. Fentanyl 75 mcg. given IV. Versed 7 mg. given IV. Cetacaine Spray 2 sprays given aerosolized.  Monitoring: BP and pulse monitoring done. Oximetry used. Supplemental O2 given  Findings Normal: Proximal Esophagus to Mid Esophagus.  BARRETT'S ESOPHAGUS: Z Line 37 cm from mouth, LES 39 cm. Length of Barrett's 2 cm. No inflammation present. Biopsy/Barrett's taken. ICD9: Barrett's: 530.2.  HIATAL HERNIA: Regular, 3 cms. in length. ICD9: Hernia, Hiatal: 553.3. MUCOSAL ABNORMALITY: Fundus to Pyloric Sphincter. Erythematous mucosa. Granular mucosa. RUT done, results pending. ICD9: Gastritis, Unspecified: 535.50.  Normal: Duodenal 2nd Portion.   Assessment  Diagnoses: 535.50: Gastritis, Unspecified.  553.3: Hernia, Hiatal.  530.2: Barrett's.   Events  Unplanned  Intervention: No unplanned interventions were required.  Unplanned Events: There were no complications. Plans Medication(s): Await pathology. PPI: QAM, for indefinitely.   Patient Education: Patient given standard instructions for: Hiatal Hernia. Reflux. Mucosal Abnormality.  Disposition: After procedure patient sent to recovery. After recovery patient sent home.  Scheduling: Primary Care Provider, as planned  Office Visit, to [facility] T. Russella Dar, MD, [facility], around Oct 22, 2007.  EGD, to [employer]. Russella Dar, MD, [facility], around Oct 21, 2009.    This report was created from the original endoscopy report, which was reviewed and signed by the above listed endoscopist.

## 2010-12-24 NOTE — Progress Notes (Signed)
Summary: Nexium Refill  Phone Note Refill Request Message from:  Fax from Pharmacy on February 21, 2010 8:11 AM  Refills Requested: Medication #1:  NEXIUM DR 40MG  CAPSULE   Dosage confirmed as above?Dosage Confirmed   Brand Name Necessary? No   Supply Requested: 1 month   Last Refilled: 01/08/2010  Method Requested: Electronic Next Appointment Scheduled: 03-25-10 11AM GI Initial call taken by: Roselle Locus,  February 21, 2010 8:12 AM  Follow-up for Phone Call        Rx completed in Dr. Tiajuana Amass Follow-up by: Glendell Docker CMA,  February 21, 2010 10:13 AM    New/Updated Medications: OMEPRAZOLE-SODIUM BICARBONATE 40-1100 MG CAPS (OMEPRAZOLE-SODIUM BICARBONATE) Take 1 capsule by mouth once a day (OFFICE VISIT NEEDED FOR FUTURE REFILLS) Prescriptions: OMEPRAZOLE-SODIUM BICARBONATE 40-1100 MG CAPS (OMEPRAZOLE-SODIUM BICARBONATE) Take 1 capsule by mouth once a day (OFFICE VISIT NEEDED FOR FUTURE REFILLS)  #30 x 0   Entered by:   Glendell Docker CMA   Authorized by:   D. Thomos Lemons DO   Signed by:   Glendell Docker CMA on 02/21/2010   Method used:   [id number]       [address]       [place], [place]  [address]       Ph: [phone] or [phone]       Fax: [phone]   RxID:   [id number]

## 2010-12-24 NOTE — Progress Notes (Signed)
Summary: Sunburn  Phone Note Call from Patient   Caller: Patient Details for Reason: Sun Burn Summary of Call:  Pt wants something called in for sunburn on chest,   states he got sunburn this  weekend in lots of pain. [facility]   [facility]   [address]  Initial call taken by: Darral Dash,  July 30, 2010 11:33 AM  Follow-up for Phone Call        call returned to patient regarding sunburn at [phone], no answer.  A voice message was left for patient to return phone call.  Glendell Docker CMA  July 30, 2010 12:33 PM   Additional Follow-up for Phone Call Additional follow up Details #1::        he can try ibuprofen 400 mg two times a day as needed.  If severe sun burn with blistering, I suggest OV.  plz advise pt to drink plenty of fluids Additional Follow-up by: D. Thomos Lemons DO,  July 30, 2010 1:49 PM    Additional Follow-up for Phone Call Additional follow up Details #2::    call placed to patient at [phone], he has been advised per Dr Artist Pais instructions Follow-up by: Glendell Docker CMA,  July 30, 2010 4:39 PM

## 2010-12-25 ENCOUNTER — Encounter: Payer: Self-pay | Admitting: Gastroenterology

## 2010-12-26 NOTE — Progress Notes (Signed)
Summary: CT Results  Phone Note Outgoing Call   Summary of Call: call pt - CT of chest negative for pulmonary embolism.  no pneumonia.  no mass or swollen lymph nodes  Initial call taken by: D. Thomos Lemons DO,  December 09, 2010 1:47 PM  Follow-up for Phone Call        call placed to patient at  [phone], he has been informed per Dr Artist Pais instructions Follow-up by: Glendell Docker CMA,  December 09, 2010 3:02 PM

## 2010-12-26 NOTE — Progress Notes (Signed)
Summary: Status Update  Phone Note Call from Patient Call back at Home Phone [phone]   Caller: Patient Call For: D. Thomos Lemons DO Summary of Call: patient states that he is still having the sensation of wantng to pass out, he states that everytime he eats he feels wierd. His pulse today was 99, and he states that he had to sit down for about an hour  today at work. He states he can't really say what cardiology did for him, but he has a follow up on Tuesday of next week. He states he is not sure if his symptoms are related to anxiety, but he still feels like he wants to pass out.  He would like to know if there are any other suggestions that Dr Artist Pais may have. Initial call taken by: Glendell Docker CMA,  December 12, 2010 5:44 PM  Follow-up for Phone Call        see if pt can come in tomorrow - 12:00PM Follow-up by: D. Thomos Lemons DO,  December 12, 2010 5:58 PM  Additional Follow-up for Phone Call Additional follow up Details #1::        patient advised appointment has been scheduled Additional Follow-up by: Glendell Docker CMA,  December 13, 2010 8:11 AM

## 2010-12-26 NOTE — Assessment & Plan Note (Signed)
Summary: np6. chestpian ,cough,bcbs. gd   Visit Type:  Initial Consult Referring Provider:  Thomos Lemons, DO Primary Provider:  Thomos Lemons, DO  CC:  chest pain, presyncope, and palpitations.  History of Present Illness: The patient is seen in consultation.  He is referred by Dr. Artist Pais.  He has had chest pain, presyncope, palpitations, and hypertension.  He was recently started on a low dose of losartan and he has an excellent blood pressure results.  Recently he had an episode of chest discomfort and felt "weak all over".  There was presyncope but no syncope.  He is also mentioned intermittent palpitations.  He continues to go about full activities.  There is no history of diabetes.  Total cholesterol is 206 and HDL 37.  He quit smoking 10 years ago.  With smoking left out of the score, his Framingham 10 year risk score is 8%.  Current Medications (verified): 1)  Losartan Potassium 25 Mg Tabs (Losartan Potassium) .... One By Mouth Once Daily 2)  Omeprazole-Sodium Bicarbonate 40-1100 Mg Caps (Omeprazole-Sodium Bicarbonate) .... One By Mouth Once Daily  Allergies (verified): No Known Drug Allergies  Past History:  Family History: Last updated: 12/09/2010 Mother is 69 years old with uterine cancer or thyroid disorder Father deceased at age 43 secondary to his stroke Grandmother with diabetes     Family History of Diabetes: Grandparents Family History of Heart Disease: Grandparents       Social History: Last updated: 12/09/2010 Single  has two boys Occupation:  Self-employed in Chief Operating Officer Former Smoker quit in 2001 Alcohol use-no  Drug use-no   Poor diet - fast food  Daily Caffeine Use-coffee     Past Medical History: GERD History of neck pain -  History of tobacco abuse     Hx of accident 06/09 - left comminuted clavicle fracture (complicated by wound infection)  Cervical Disc Disease  Varicose Veins Hiatal Hernia Gastritis Chest  Pain Palpitations Presyncope  Review of Systems       Patient denies fever, chills.  He has had some mild headaches..  Patient denies sweats, change in vision, change in hearing.  He has had a mild cough intermittently over time.  He denies nausea vomiting or urinary symptoms.  All other systems are reviewed and are negative.  Vital Signs:  Patient profile:   45 year old male Height:      75.5 inches Weight:      259 pounds BMI:     32.06 Pulse rate:   75 / minute BP sitting:   128 / 86  (left arm) Cuff size:   large  Vitals Entered By: Hardin Negus, RMA (December 11, 2010 10:06 AM)  Physical Exam  General:  patient is stable and somewhat overweight. Head:  head is atraumatic. Eyes:  no xanthelasma. Neck:  no jugular venous distention. Chest Wall:  no chest wall tenderness. Lungs:  lungs are clear.  Respiratory effort is unlabored. Heart:  cardiac exam reveals S1 and S2.  There are no clicks or significant murmurs. Abdomen:  abdomen is soft. Msk:  no musculoskeletal deformities. Extremities:  no peripheral edema. Skin:  no skin rashes.  He does have tattoos on his arms. Psych:  patient is oriented to person time and place.  Affect is normal.   Impression & Recommendations:  Problem # 1:  * PRESYNCOPE  The patient had presyncope last week.  At this point there is no documented arrhythmia.  Two-dimensional echo will be done to assess LV function  and valvular function to be sure that he does not have any potential underlying cause for syncope or presyncope.  Orders: Treadmill (Treadmill) Echocardiogram (Echo)  Problem # 2:  PALPITATIONS (ICD-785.1)  The patient has had mild palpitations.  He does not need a Holter monitor at this time.  We will assess his LV function with echo.  We will evaluate his rhythm response walking on a standard treadmill.  Orders: Treadmill (Treadmill) Echocardiogram (Echo)  Problem # 3:  HYPERTENSION (ICD-401.9)  His updated medication  list for this problem includes:    Losartan Potassium 25 Mg Tabs (Losartan potassium) ..... One by mouth once daily Patient was recently started on losartan.  Blood pressure stable today.  No change in therapy.  Problem # 4:  CHEST PAIN (ICD-786.50)  The patient has had some chest pain.  There was question of slight radiation down his arms.  I have reviewed EKG from Dr. Olegario Messier office dated December 09, 2010.  There is a wavy baseline.  There is normal sinus rhythm.  There is question of left atrial enlargement.  There is no significant QRS change.  2-D echo will be done to assess his LV and LA.  Standard treadmill will be done to further assess his chest discomfort.  Orders: Treadmill (Treadmill) Echocardiogram (Echo)  Problem # 5:  COUGH (ICD-786.2) The patient quit smoking in 2001.  When he has a respiratory infection he did have a prolonged cough.  This does not appear to be an active problem at this time.  Patient Instructions: 1)  Your physician has requested that you have an echocardiogram.  Echocardiography is a painless test that uses sound waves to create images of your heart. It provides your doctor with information about the size and shape of your heart and how well your heart's chambers and valves are working.  This procedure takes approximately one hour. There are no restrictions for this procedure. 2)  Your physician has requested that you have an exercise tolerance test.  For further information please visit [email or web address]  Please also follow instruction sheet, as given. 3)  Follow up in 3-4 weeks

## 2010-12-26 NOTE — Assessment & Plan Note (Signed)
Summary: SINUS/MHF   Vital Signs:  Patient profile:   45 year old male Height:      75.5 inches Weight:      258.50 pounds BMI:     32.00 O2 Sat:      98 % on Room air Temp:     97.4 degrees F oral Pulse rate:   76 / minute Resp:     16 per minute BP sitting:   110 / 60  (left arm) Cuff size:   large  Vitals Entered By: Glendell Docker CMA (November 15, 2010 10:50 AM)  O2 Flow:  Room air CC: Sinus Pain Is Patient Diabetic? No Pain Assessment Patient in pain? no      Comments c/o sinus pain, throat pain, sinus  drainage yellow/green in color, headache- Advil taken with some relief   Primary Care Provider:  Thomos Lemons, DO  CC:  Sinus Pain.  History of Present Illness: maxillary pain sinus congestion onset 2 days  occ coughs up hard secretions  Preventive Screening-Counseling & Management  Alcohol-Tobacco     Smoking Status: quit  Allergies (verified): No Known Drug Allergies  Past History:  Past Medical History: GERD History of neck pain -  History of tobacco abuse     Hx of accident 06/09 - left comminuted clavicle fracture (complicated by wound infection)  Cervical Disc Disease Varicose Veins Hiatal Hernia Gastritis  Past Surgical History: Open reduction internal fixation of left clavicle fracture using Katrinka Blazing and Matthew anatomic clavicle plate.  -  81/1914      Family History: Mother is 62 years old with uterine cancer or thyroid disorder Father deceased at age 47 secondary to his stroke Grandmother with diabetes     Family History of Diabetes: Grandparents Family History of Heart Disease: Grandparents      Social History: Single  has two boys Occupation:  Self-employed in Chief Operating Officer Former Smoker quit in 2001 Alcohol use-no  Drug use-no  Poor diet - fast food  Daily Caffeine Use-coffee     Physical Exam  General:  alert, well-developed, and well-nourished.   Ears:  R ear normal and L ear normal.   Mouth:  pharyngeal  erythema.   Neck:  supple and no masses.   Lungs:  normal respiratory effort, normal breath sounds, no crackles, and no wheezes.   Heart:  normal rate, regular rhythm, and no gallop.     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 One by mouth two times a day    Clindamycin Hcl 150 Mg Caps (Clindamycin hcl) ..... One by mouth three times a day His updated medication list for this problem includes:    Amoxicillin-pot Clavulanate 875-125 Mg Tabs (Amoxicillin-pot clavulanate) ..... One by mouth two times a day  Complete Medication List: 1)  Prevacid 24hr 15 Mg Cpdr (Lansoprazole) .... Take 1 capsule by mouth once a day 2)  Amoxicillin-pot Clavulanate 875-125 Mg Tabs (Amoxicillin-pot clavulanate) .... One by mouth two times a day  Patient Instructions: 1)  Use neil med sinus rinse over the counter 2)  Call our office if your symptoms do not  improve or gets worse. Prescriptions: AMOXICILLIN-POT CLAVULANATE 875-125 MG TABS (AMOXICILLIN-POT CLAVULANATE) one by mouth two times a day  #20 x 0   Entered and Authorized by:   D. Thomos Lemons DO   Signed by:   D.  Thomos Lemons DO on 11/15/2010   Method used:   Print then Give to Patient   RxID:   [id number]    Orders Added: 1)  Est. Patient Level III [62952]    Current Allergies (reviewed today): No known allergies

## 2010-12-26 NOTE — Assessment & Plan Note (Signed)
Summary: per darlene/mhf   Vital Signs:  Patient profile:   45 year old male Height:      75.5 inches O2 Sat:      97 % on Room air Temp:     97.9 degrees F oral Pulse rate:   92 / minute Resp:     20 per minute BP sitting:   112 / 80  (left arm) Cuff size:   large  Vitals Entered By: Glendell Docker CMA (December 13, 2010 12:21 PM)  O2 Flow:  Room air CC: follow-up visit Is Patient Diabetic? No Pain Assessment Patient in pain? no      Comments still unresolved episodes of sensation of wanting to pass out, increase in pain in left arm, rx for stomach not working, unable to belch, worse after meals,has a burning sensation in stomach, foods just sits inhis upper stomach, felt liek his neck tightned up , and his throat was closing after eating salad yesterday.   Primary Care Provider:  Dondra Spry DO  CC:  follow-up visit.  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 neck got real tight after eating salad felt like he was going to pass out  pt also seen by vein specialist re:  left lower ext varicose veins no improvement with compression stockings his jobs involved lifiting and prolonged standing he has chronic left calf soreness  Preventive Screening-Counseling & Management  Alcohol-Tobacco     Smoking Status: quit  Allergies (verified): No Known Drug Allergies  Past History:  Past Medical History: GERD History of neck pain -   History of tobacco abuse     Hx of accident 06/09 - left comminuted clavicle fracture (complicated by wound infection)  Cervical Disc Disease  Varicose Veins Hiatal Hernia Gastritis Chest Pain Palpitations Presyncope  Past Surgical History: Open reduction internal fixation of left clavicle fracture using Katrinka Blazing and Matthew anatomic clavicle plate.  -  91/4782        Social History: Single  has two boys Occupation:  Self-employed in Chief Operating Officer Former Smoker quit in 2001 Alcohol use-no  Drug use-no      Poor diet - fast food  Daily Caffeine Use-coffee     Physical Exam  General:  alert, well-developed, and well-nourished.   Head:  normocephalic and atraumatic.   Mouth:  pharynx pink and moist.   Neck:  No deformities, masses, or tenderness noted. Lungs:  normal respiratory effort and normal breath sounds.   Heart:  normal rate, regular rhythm, and no gallop.   Abdomen:  soft, epigastric tenderness Extremities:  No lower extremity edema  Neurologic:  cranial nerves II-XII intact and gait normal.     Impression & Recommendations:  Problem # 1:  GASTROESOPHAGEAL REFLUX DISEASE (ICD-530.81) Assessment Deteriorated I suspect pt's recent  chest pain related to GI issue. no improvement with zegerid switch to protonix and add sucralfate refer to GI for repeat EGD question esophageal spasm  The following medications were removed from the medication list:    Omeprazole-sodium Bicarbonate 40-1100 Mg Caps (Omeprazole-sodium bicarbonate) ..... One by mouth once daily His updated medication list for this problem includes:    Pantoprazole Sodium 40 Mg Tbec (Pantoprazole sodium) ..... One by mouth two times a day    Sucralfate 1 Gm/70ml Susp (Sucralfate) .Marland KitchenMarland KitchenMarland KitchenMarland Kitchen 10 ml by mouth three times a day  Orders: Gastroenterology Referral (GI)  Problem # 2:  CHEST PAIN (ICD-786.50) proceed with additional cardiac  testing considering multiple risk factors  Complete Medication List:  1)  Losartan Potassium 25 Mg Tabs (Losartan potassium) .... 2 tabs by mouth once daily 2)  Pantoprazole Sodium 40 Mg Tbec (Pantoprazole sodium) .... One by mouth two times a day 3)  Alprazolam 0.25 Mg Tabs (Alprazolam) .... One by mouth two times a day as needed 4)  Sucralfate 1 Gm/98ml Susp (Sucralfate) .Marland Kitchen.. 10 ml by mouth three times a day  Patient Instructions: 1)  Please schedule a follow-up appointment in 2 months. Prescriptions: SUCRALFATE 1 GM/10ML SUSP (SUCRALFATE) 10 ml by mouth three times a day  #100 ml  x 1   Entered and Authorized by:   D. Thomos Lemons DO   Signed by:   D. Thomos Lemons DO on 12/13/2010   Method used:   [id number]       [address]       [place], [place]  [address]       Ph: [phone] or [phone]       Fax: [phone]   RxID:   [id number] ALPRAZOLAM 0.25 MG TABS (ALPRAZOLAM) one by mouth two times a day as needed  #60 x 0   Entered and Authorized by:   D. Thomos Lemons DO   Signed by:   D. Thomos Lemons DO on 12/13/2010   Method used:   Print then Give to Patient   RxID:   [id number] PANTOPRAZOLE SODIUM 40 MG TBEC (PANTOPRAZOLE SODIUM) one by mouth two times a day  #60 x 1   Entered and Authorized by:   D. Thomos Lemons DO   Signed by:   D. Thomos Lemons DO on 12/13/2010   Method used:   [id number]       [address] [place], [place]  [address]       Ph: [phone] or [phone]       Fax: [phone]   RxID:   [id number]    Orders Added: 1)  Gastroenterology Referral [GI] 2)  Est. Patient Level IV [62831]    Current Allergies (reviewed today): No known allergies

## 2010-12-26 NOTE — Assessment & Plan Note (Signed)
Summary: COUGH/MHF--Rm 5   Vital Signs:  Patient profile:   45 year old male Height:      75.5 inches Weight:      260 pounds BMI:     32.18 O2 Sat:      97 % on Room air Temp:     98.7 degrees F oral Pulse rate:   88 / minute Pulse rhythm:   regular Resp:     18 per minute BP sitting:   136 / 100  (right arm) Cuff size:   large  Vitals Entered By: Mervin Kung CMA Duncan Dull) (November 20, 2010 8:52 AM)  O2 Flow:  Room air CC: Pt states he was seen on Friday for sinus symptoms. Now has cough and feels like his chest is on fire. OTC meds and amoxicillin not helping. Is Patient Diabetic? No Pain Assessment Patient in pain? no      Comments Pt agrees all med doses and directions are correct.  Pt states he tried some of his mom's cough med with codeine but it did not seem to help. Nicki Guadalajara Fergerson CMA Duncan Dull)  November 20, 2010 8:58 AM    Primary Care Provider:  Thomos Lemons, DO  CC:  Pt states he was seen on Friday for sinus symptoms. Now has cough and feels like his chest is on fire. OTC meds and amoxicillin not helping.Marland Kitchen  History of Present Illness: Pt saw Dr. Artist Pais on Friday,  was given rx for augmentin for sinusitus.  Since that time sinus pressure has improved.  Today her presents with cheif complaint of cough since Saturday night.  Productive at times.  Worse at night when he lays down.  Improves with sitting up.  Does not smoke quit in 2001.  Allergies (verified): No Known Drug Allergies  Past History:  Past Medical History: Last updated: 10/16/2010 GERD History of neck pain -  History of tobacco abuse    Hx of accident 06/09 - left comminuted clavicle fracture (complicated by wound infection)  Cervical Disc Disease Varicose Veins Hiatal Hernia Gastritis  Past Surgical History: Last updated: 10/16/2010 Open reduction internal fixation of left clavicle fracture using Katrinka Blazing and Molli Hazard anatomic clavicle plate.  -  16/1096    Review of Systems       see  HPI  Physical Exam  General:  Well-developed,well-nourished,in no acute distress; alert,appropriate and cooperative throughout examination Head:  Normocephalic and atraumatic without obvious abnormalities. No apparent alopecia or balding. Eyes:  PERRLA, sclera are clear Ears:  External ear exam shows no significant lesions or deformities.  Otoscopic examination reveals clear canals, tympanic membranes are intact bilaterally without bulging, retraction, inflammation or discharge. Hearing is grossly normal bilaterally. Mouth:  Oral mucosa and oropharynx without lesions or exudates.  Teeth in good repair. Neck:  No deformities, masses, or tenderness noted. Lungs:  Normal respiratory effort, chest expands symmetrically. Lungs are clear to auscultation, no crackles or wheezes. Heart:  Normal rate and regular rhythm. S1 and S2 normal without gallop, murmur, click, rub or other extra sounds. Psych:  Cognition and judgment appear intact. Alert and cooperative with normal attention span and concentration. No apparent delusions, illusions, hallucinations   Impression & Recommendations:  Problem # 1:  BRONCHITIS (ICD-490) Assessment New chest x-ray is negative, we'll plan to have patient continue the Augmentin, and we'll add Tessalon Perles for cough. Orders: CXR- 2view (CXR)  His updated medication list for this problem includes:    Amoxicillin-pot Clavulanate 875-125 Mg Tabs (Amoxicillin-pot clavulanate) ..... One by  mouth two times a day    Tessalon Perles 100 Mg Caps (Benzonatate) ..... One cap by mouth three times a day as needed for cough  Complete Medication List: 1)  Prevacid 24hr 15 Mg Cpdr (Lansoprazole) .... Take 1 capsule by mouth once a day 2)  Amoxicillin-pot Clavulanate 875-125 Mg Tabs (Amoxicillin-pot clavulanate) .... One by mouth two times a day 3)  Tessalon Perles 100 Mg Caps (Benzonatate) .... One cap by mouth three times a day as needed for cough  Patient Instructions: 1)   Please complete your chest x-ray on the first floor. 2)  Call if symptoms worsen, or if they are not improved in 48-72 hours. 3)  Please book a nurse visit in 1 month for a blood pressure check.  Prescriptions: TESSALON PERLES 100 MG CAPS (BENZONATATE) one cap by mouth three times a day as needed for cough  #30 x 0   Entered and Authorized by:   Lemont Fillers FNP   Signed by:   Lemont Fillers FNP on 11/20/2010   Method used:   [facility] to        [facility]  [address] [facility] [phone][facility]ail[facility]       [address] [place], [place]  [address]       Ph: [phone] or [phone]       Fax: [phone]   RxID:   [id number]    Orders Added: 1)  CXR- 2view [CXR] 2)  Est. Patient Level III [[address]]    Current Allergies (reviewed today): No known allergies

## 2010-12-26 NOTE — Assessment & Plan Note (Signed)
Summary: [facility]   Visit Type:  office visit after treadmill Referring Provider:  Thomos Lemons, DO Primary Provider:  Thomos Lemons, DO   History of Present Illness: The patient had a treadmill and a two-dimensional echo today.  I did the treadmill with him.  There was no abnormality.  I had seen him last on December 11, 2010.  There is question of presyncope.  He had some chest discomfort.  There is a history of hypertension and his losartan and had just been started prior to that office visit.  Today on the treadmill he exercised reasonably well.  He had some leg discomfort that is probably not claudication.  There is no chest pain.  EKGs revealed no significant abnormalities.  There was no significant ectopy.  Patient also had a 2-D echo.  He has normal left ventricular systolic function.  There was a trivial pericardial effusion which is of no clinical significance.  There is evidence of diastolic dysfunction.  This is most likely related to his hypertensive disease.  Allergies: No Known Drug Allergies  Past History:  Past Medical History: GERD History of neck pain -  History of tobacco abuse     Hx of accident 06/09 - left comminuted clavicle fracture (complicated by wound infection)  Cervical Disc Disease  Varicose Veins Hiatal Hernia Gastritis Chest Pain   treadmill December 17, 2010... no ischemia and no arrhythmia Palpitations Presyncope  echo and treadmill done December 17, 2010..... no further workup EF 60%... echo... December 17, 2010.... diastolic dysfunction   Impression & Recommendations:  Problem # 1:  * DIASTOLIC DYSFUNCTION Echo on December 17, 2009 the E/A ratio was 1.4.  The deceleration time is shortened.  Because of these parameters the study is read as showing some diastolic dysfunction.  The patient does not have any clinical symptoms related to this.  He does have hypertension.  Complete treatment of his hypertension will be the recommended course.  No  other workup is needed.  Problem # 2:  * PRESYNCOPE No further workup needed  Problem # 3:  HYPERTENSION (ICD-401.9)  His updated medication list for this problem includes:    Losartan Potassium 25 Mg Tabs (Losartan potassium) .Marland Kitchen... 2 tabs by mouth once daily I have increased his losartan 50 mg daily.  He will see his primary M.D.'s.  Careful attention needs to be taken to be sure that this man is treated for his hypertension and that he remains normotensive  Problem # 4:  CHEST PAIN (ICD-786.50) Treadmill shows no abnormality.  No further workup at this time.  Problem # 5:  PALPITATIONS (ICD-785.1) No further workup is needed.

## 2010-12-26 NOTE — Assessment & Plan Note (Signed)
Summary: follow up from ED blood pressure high/mhf   Vital Signs:  Patient profile:   45 year old male Height:      75.5 inches Weight:      261.25 pounds O2 Sat:      98 % on Room air Temp:     97.7 degrees F oral Pulse rate:   70 / minute Resp:     18 per minute BP sitting:   140 / 100  (right arm) Cuff size:   large  Vitals Entered By: Glendell Docker CMA (December 09, 2010 11:16 AM)  O2 Flow:  Room air CC: ER Follow up Is Patient Diabetic? No Pain Assessment Patient in pain? no      Comments seen in ER for elevated blood pressure and was advised to follow up, still has a lingering cough,   Primary Care Provider:  Thomos Lemons, DO  CC:  ER Follow up.  History of Present Illness: 45 y/o white male for ER f/u re:  chest pains  saturday-got up as usual started feeling weird arms started to feel numb and pt experienced chest discomfort pain seems to go all the way through to back intermittently coughing  up yellowish thick mucus,  no fever  both arms hurt when he raises them above his head no alcohol use      Preventive Screening-Counseling & Management  Alcohol-Tobacco     Smoking Status: quit  Allergies (verified): No Known Drug Allergies  Past History:  Past Medical History: GERD History of neck pain -  History of tobacco abuse     Hx of accident 06/09 - left comminuted clavicle fracture (complicated by wound infection)  Cervical Disc Disease  Varicose Veins Hiatal Hernia Gastritis  Past Surgical History: Open reduction internal fixation of left clavicle fracture using Katrinka Blazing and Matthew anatomic clavicle plate.  -  16/1096       Family History: Mother is 32 years old with uterine cancer or thyroid disorder Father deceased at age 67 secondary to his stroke Grandmother with diabetes     Family History of Diabetes: Grandparents Family History of Heart Disease: Grandparents       Social History: Single  has two boys Occupation:   Self-employed in Chief Operating Officer Former Smoker quit in 2001 Alcohol use-no  Drug use-no   Poor diet - fast food  Daily Caffeine Use-coffee     Review of Systems       The patient complains of chest pain.  The patient denies fever.         heartburn  Physical Exam  General:  alert and overweight-appearing.   Head:  normocephalic and atraumatic.   Mouth:  pharynx pink and moist.   Neck:  No deformities, masses, or tenderness noted. Lungs:  normal respiratory effort, normal breath sounds, no crackles, and no wheezes.   Heart:  normal rate, regular rhythm, no murmur, and no gallop.   Neurologic:  cranial nerves II-XII intact and gait normal.   Psych:  slightly anxious.     Impression & Recommendations:  Problem # 1:  CHEST PAIN (ICD-786.50) 45 y/o atypical chest pain.  I doubt cardiac etiology but pt with multiple risk factors.  refer to cardiology for further cardiac testing rule out PE and other acute chest abnormality question anxiety trigger  Orders: CT with Contrast (CT w/ contrast) Cardiology Referral (Cardiology)  Problem # 2:  HYPERTENSION (ICD-401.9)  His updated medication list for this problem includes:    Losartan Potassium 25  Mg Tabs (Losartan potassium) ..... One by mouth once daily  BP today: 140/100 Prior BP: 136/100 (11/20/2010)  Labs Reviewed: K+: 4.2 (11/09/2009) Creat: : 0.97 (11/09/2009)   Chol: 171 (10/25/2009)   HDL: 39 (10/25/2009)   LDL: 111 (10/25/2009)   TG: 104 (10/25/2009)  Problem # 3:  GASTROESOPHAGEAL REFLUX DISEASE (ICD-530.81) pt having persistent GERD.  he has only been taking OTC prevacid switch to decaf coffee.   use zegerid  The following medications were removed from the medication list:    Prevacid 24hr 15 Mg Cpdr (Lansoprazole) .Marland Kitchen... Take 1 capsule by mouth once a day His updated medication list for this problem includes:    Omeprazole-sodium Bicarbonate 40-1100 Mg Caps (Omeprazole-sodium bicarbonate) ..... One by  mouth once daily  Problem # 4:  COUGH (ICD-786.2)  Orders: CT with Contrast (CT w/ contrast)  Complete Medication List: 1)  Losartan Potassium 25 Mg Tabs (Losartan potassium) .... One by mouth once daily 2)  Omeprazole-sodium Bicarbonate 40-1100 Mg Caps (Omeprazole-sodium bicarbonate) .... One by mouth once daily  Patient Instructions: 1)  Our office will contat you re:  cardiology referral 2)  Our offie will contact you re:  CT of chest results 3)  Please avoid caffeine  4)  Pleave avoid over the counter NSAIDs 5)  Please schedule a follow-up appointment in 2 weeks. 6)  Call our office if your symptoms do not  improve or gets worse. Prescriptions: OMEPRAZOLE-SODIUM BICARBONATE 40-1100 MG CAPS (OMEPRAZOLE-SODIUM BICARBONATE) one by mouth once daily  #30 x 3   Entered and Authorized by:   D. Thomos Lemons DO   Signed by:   D. Thomos Lemons DO on 12/09/2010   Method used:   [facility]  [id number]       [address]       [place], [place]  [address]       Ph: [phone] or [phone]       Fax: [phone]   RxID:   [id number] LOSARTAN POTASSIUM 25 MG TABS (LOSARTAN POTASSIUM) one by mouth once daily  #30 x 1   Entered and Authorized by:   D. Thomos Lemons DO   Signed by:   D. Thomos Lemons DO on 12/09/2010   Method used:   [facility]        [facility]  [id number]       [address]       [place], [place]  [address]       Ph: [phone] or [phone]       Fax: [phone]   RxID:   [id number]    Orders Added: 1)  CT with Contrast [CT w/ contrast] 2)  Cardiology Referral [Cardiology]    Current Allergies (reviewed today): No known allergies    Appended Document: follow up from ED blood pressure high/mhf pt previously seen at vein clinic re:  left calf varicose veins. possible laser therapy being considered pt with mild swelling of left calf with tenderness

## 2010-12-27 ENCOUNTER — Telehealth: Payer: Self-pay | Admitting: Internal Medicine

## 2010-12-30 ENCOUNTER — Other Ambulatory Visit (AMBULATORY_SURGERY_CENTER): Payer: BC Managed Care – PPO | Admitting: Gastroenterology

## 2010-12-30 ENCOUNTER — Encounter: Payer: Self-pay | Admitting: Gastroenterology

## 2010-12-30 DIAGNOSIS — R1319 Other dysphagia: Secondary | ICD-10-CM

## 2010-12-30 DIAGNOSIS — K219 Gastro-esophageal reflux disease without esophagitis: Secondary | ICD-10-CM

## 2010-12-30 DIAGNOSIS — K449 Diaphragmatic hernia without obstruction or gangrene: Secondary | ICD-10-CM

## 2011-01-01 NOTE — Letter (Signed)
Summary: EGD Instructions  [facility]  [address] [place], [place] [address]   Phone: [phone]  Fax: [phone]       Raymond Mathews    [birth year]    MRN: [id number]       Procedure Day /Date: Monday February 6th, 2012     Arrival Time:  7:30am     Procedure Time: 8:30am     Location of Procedure:                    _ x _  [facility] (4th Floor)    PREPARATION FOR ENDOSCOPY   On 12/30/10 THE DAY OF THE PROCEDURE:  1.   No solid foods, milk or milk products are allowed after midnight the night before your procedure.  2.   Do not drink anything colored red or purple.  Avoid juices with pulp.  No orange juice.  3.  You may drink clear liquids until 6:30am, which is 2 hours before your procedure.                                                                                                CLEAR LIQUIDS INCLUDE: Water Jello Ice Popsicles Tea (sugar ok, no milk/cream) Powdered fruit flavored drinks Coffee (sugar ok, no milk/cream) Gatorade Juice: apple, white grape, white cranberry  Lemonade Clear bullion, consomm, broth Carbonated beverages (any kind) Strained chicken noodle soup Hard Candy   MEDICATION INSTRUCTIONS  Unless otherwise instructed, you should take regular prescription medications with a small sip of water as early as possible the morning of your procedure        OTHER INSTRUCTIONS  You will need a responsible adult at least 44 years of age to accompany you and drive you home.   This person must remain in the waiting room during your procedure.  Wear loose fitting clothing that is easily removed.  Leave jewelry and other valuables at home.  However, you may wish to bring a book to read or an iPod/MP3 player to listen to music as you wait for your procedure to start.  Remove all body piercing jewelry and leave at home.  Total time from sign-in until discharge is approximately 2-3 hours.  You should go home  directly after your procedure and rest.  You can resume normal activities the day after your procedure.  The day of your procedure you should not:   Drive   Make legal decisions   Operate machinery   Drink alcohol   Return to work  You will receive specific instructions about eating, activities and medications before you leave.    The above instructions have been reviewed and explained to me by   Marchelle Folks.     I fully understand and can verbalize these instructions _____________________________ Date _________

## 2011-01-01 NOTE — Progress Notes (Signed)
Summary: questions on bp med  Phone Note Call from Patient Call back at Home Phone [phone]   Caller: Patient Call For: darlene Reason for Call: Talk to Nurse Summary of Call: pt called and has questions regarding his bp medication. phone# [phone]. please assist. Initial call taken by: Elba Barman,  December 27, 2010 9:58 AM  Follow-up for Phone Call        patient called and left voice message requesting a refill on his Losartan Potassium. He states his cardiologist increased his dosage to 2 pills a day. Follow-up by: Glendell Docker CMA,  December 27, 2010 10:13 AM  Additional Follow-up for Phone Call Additional follow up Details #1::        I will change to 50 mg Additional Follow-up by: D. Thomos Lemons DO,  December 27, 2010 1:15 PM    Additional Follow-up for Phone Call Additional follow up Details #2::    call returned to patient at [phone], no answer. A detailed voice message was left informing patient of medication to phamarcy. Follow-up by: Glendell Docker CMA,  December 27, 2010 1:42 PM  New/Updated Medications: LOSARTAN POTASSIUM 50 MG TABS (LOSARTAN POTASSIUM) one by mouth once daily Prescriptions: LOSARTAN POTASSIUM 50 MG TABS (LOSARTAN POTASSIUM) one by mouth once daily  #30 x 1   Entered and Authorized by:   D. Thomos Lemons DO   Signed by:   D. Thomos Lemons DO on 12/27/2010   Method used:   [facility]        [facility]  [id number]       [address]       [place], [place]  [address]       Ph: [phone] or [phone]       Fax: [phone]   RxID:   [id number]

## 2011-01-01 NOTE — Assessment & Plan Note (Addendum)
Summary: GASTRO REFLUX DISEASE/SCREEN FOR ENDO/YF   History of Present Illness Visit Type: Initial Consult Primary GI MD: Elie Goody MD [facility] Primary Provider: Thomos Lemons, DO Requesting Provider: Thomos Lemons, DO Chief Complaint: Patient c/o years GERD which is now worsening. He c/o reflux waking him at night. He also has some nausea and chest discomfort. History of Present Illness:   Raymond Mathews complains of worsening problems with regurgitation and reflux for the past few months. He states he has nighttime regurgitation at least every week . He has severe episode at least every 2 weeks. He notes epigastric discomfort, nausea, vomiting, bloating, chest pain, and solid food dysphagia over the past few weeks. He states he sleeps on 4 pillows at night and has been compliant with pantoprazole 40 mg twice daily. He underwent endoscopy in November 2007 for chest pain and reflux that showed a hiatal hernia, gastritis, and an irregular z line. Biopsies did not show Barretts.   GI Review of Systems    Reports acid reflux, bloating, chest pain, dysphagia with solids, heartburn, loss of appetite, and  nausea.      Denies abdominal pain, belching, dysphagia with liquids, vomiting, vomiting blood, weight loss, and  weight gain.        Denies anal fissure, black tarry stools, change in bowel habit, constipation, diarrhea, diverticulosis, fecal incontinence, heme positive stool, hemorrhoids, irritable bowel syndrome, jaundice, light color stool, liver problems, rectal bleeding, and  rectal pain. Current Medications (verified): 1)  Losartan Potassium 25 Mg Tabs (Losartan Potassium) .... 2 Tabs By Mouth Once Daily 2)  Pantoprazole Sodium 40 Mg Tbec (Pantoprazole Sodium) .... One By Mouth Two Times A Day 3)  Alprazolam 0.25 Mg Tabs (Alprazolam) .... One By Mouth Two Times A Day As Needed  Allergies (verified): No Known Drug Allergies  Past History:  Past Medical History: Reviewed history from  12/17/2010 and no changes required. GERD History of neck pain -  History of tobacco abuse     Hx of accident 06/09 - left comminuted clavicle fracture (complicated by wound infection)  Cervical Disc Disease  Varicose Veins Hiatal Hernia Gastritis Chest Pain   treadmill December 17, 2010... no ischemia and no arrhythmia Palpitations Presyncope  echo and treadmill done December 17, 2010..... no further workup EF 60%... echo... December 17, 2010.... diastolic dysfunction  Past Surgical History: Open reduction internal fixation of left clavicle fracture using Katrinka Blazing and Matthew anatomic clavicle plate.  -  16/1096  -plate later removed  Family History: Mother is 45 years old with uterine cancer or thyroid disorder Father deceased at age 26 secondary to his stroke Grandmother with diabetes     Family History of Diabetes: Grandparents Family History of Heart Disease: Grandparents  Family History of Colon Cancer: Grandfather?  Social History: Single  has two boys Occupation:  Self-employed in Chief Operating Officer Former Smoker quit in 2001 Alcohol use-no  Drug use-no    Poor diet - fast food  Daily Caffeine Use-coffee-1 cup daily     Review of Systems       The patient complains of allergy/sinus, arthritis/joint pain, back pain, fatigue, muscle pains/cramps, shortness of breath, sleeping problems, and sore throat.         The pertinent positives and negatives are noted as above and in the HPI. All other ROS were reviewed and were negative.   Vital Signs:  Patient profile:   45 year old male Height:      75.5 inches Weight:  257.38 pounds Pulse rate:   80 / minute Pulse rhythm:   irregular BP sitting:   124 / 74  (left arm)  Vitals Entered By: Lamona Curl CMA Duncan Dull) (December 24, 2010 11:33 AM)  Physical Exam  General:  Well developed, well nourished, no acute distress. Head:  Normocephalic and atraumatic. Eyes:  PERRLA, no icterus. Ears:  Normal auditory  acuity. Mouth:  No deformity or lesions, dentition normal. Neck:  Supple; no masses or thyromegaly. Lungs:  Clear throughout to auscultation. Heart:  Regular rate and rhythm; no murmurs, rubs,  or bruits. Abdomen:  Soft, nontender and nondistended. No masses, hepatosplenomegaly or hernias noted. Normal bowel sounds. Msk:  Symmetrical with no gross deformities. Normal posture. Pulses:  Normal pulses noted. Extremities:  No clubbing, cyanosis, edema or deformities noted. Neurologic:  Alert and  oriented x4;  grossly normal neurologically. Skin:  Intact without significant lesions or rashes. Cervical Nodes:  No significant cervical adenopathy. Axillary Nodes:  No significant axillary adenopathy. Inguinal Nodes:  No significant inguinal adenopathy. Psych:  Alert and cooperative. Anxious and easily distracted.    Impression & Recommendations:  Problem # 1:  GASTROESOPHAGEAL REFLUX DISEASE (ICD-530.81) Refractory reflux symptoms with new onset solid food dysphagia and frequent regurgitation. Rule out esophageal strictures, worsening esophagitis, ulcer disease and gastroparesis. Dexilant 60 mg q.a.m. and pantoprazole 40 mg q.p.m. and 4 inch bedblocks. The risks, benefits and alternatives to endoscopy with possible biopsy and possible dilation were discussed with the patient and they consent to proceed. The procedure will be scheduled electively. Orders: EGD (EGD)  Problem # 2:  CHEST PAIN (ICD-786.50) Symptoms could be secondary to GERD as above. He appears to have a component of anxiety that is contributing to his symptoms. Orders: EGD (EGD)  Problem # 3:  DYSPHAGIA (ZOX-096.04) See problem #1.  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  Patient Instructions: 1)  Take pantoprazole one tablet by mouth every evening and start Dexilant one tablet by mouth every morning. A prescription has been sent to your  pharmacy.  2)  Avoid foods high in acid content ( tomatoes, citrus juices, spicy foods) . Avoid eating within 3 to 4 hours of lying down or before exercising. Do not over eat; try smaller more frequent meals. Elevate head of bed four inches when sleeping.  3)  Upper Endoscopy brochure given.  4)  Copy sent to : Thomos Lemons, DO 5)  The medication list was reviewed and reconciled.  All changed / newly prescribed medications were explained.  A complete medication list was provided to the patient / caregiver.  Prescriptions: DEXILANT 60 MG CPDR (DEXLANSOPRAZOLE) one tablet by mouth every morning  #30 x 11   Entered by:   Christie Nottingham CMA (AAMA)   Authorized by:   Meryl Dare MD [facility]   Signed by:   Christie Nottingham CMA (AAMA) on 12/24/2010   Method used:   [facility]        [facility]  [address] [phone][facility]ail)       [address]       [place], [place]  [address]       Ph: [phone] or [phone]       Fax: [phone]   RxID:   [id number]

## 2011-01-01 NOTE — Progress Notes (Signed)
Summary: cant keep anything down   Phone Note Call from Patient Call back at Home Phone [phone] Message from:  Patient on December 20, 2010 1:13 PM  Caller: Patient Call For: Raymond Mathews  Summary of Call: patient went to Cardiologist and did stress test this week.. They doubled up on his blood pressure medicine losartin postassium. He cannot eat anything that stays down.  This has been going on for 3 days.  He has an appt at GI on Tuesday and they are saying this is a consult and he feels he needs something soon.    Initial call taken by: Roselle Locus,  December 20, 2010 1:15 PM  Follow-up for Phone Call        plz call information to clarity symptoms he is having Follow-up by: D. Thomos Lemons DO,  December 23, 2010 1:26 PM  Additional Follow-up for Phone Call Additional follow up Details #1::        call returned to patient at [phone], he states that his symptoms has improved. He states since his blood pressure medication was doubled it did not matter what he ate, he was having trouble keeping stuff down. He has been taking heartburn medication twice a day. He is scheduled to seek Dr Russella Dar tomorrow. He states that he has cut back on the amount of food intake. He states his blood pressure readings have been good since doubling up on blood pressure medication Additional Follow-up by: Glendell Docker CMA,  December 23, 2010 1:41 PM    Additional Follow-up for Phone Call Additional follow up Details #2::    noted Follow-up by: D. Thomos Lemons DO,  December 23, 2010 4:26 PM

## 2011-01-01 NOTE — Progress Notes (Addendum)
Summary: ? [facility]  [facility]  Phone Note Call from Patient Call back at Home Phone [phone] Call back at [phone]   Caller: Patient Call For: D. Thomos Lemons DO Summary of Call: patient states he was seen by vascular & vein clinic, about 2 months ago. He states that he needs notes to the [facility] Clinic stating that he was evaluated in the office for his leg pain related to vein problems. He states he is not sure what is needed. They will not schedule a appointment/procedure without Dr Artist Pais approval. He also wanted Dr Artist Pais to know that he is still having problems with shortness of breath, and feels like he is going to pass out. He states his cardiology appointment is one month out and wanted to know if that could be moved up earlier. Initial call taken by: Glendell Docker CMA,  December 10, 2010 1:24 PM  Follow-up for Phone Call        Cardiology appt has been scheduled with Dr Myrtis Ser tomorrow at 9:45am at [facility] office. Pt has been notified. Nicki Guadalajara Fergerson CMA Duncan Dull)  December 10, 2010 4:07 PM   Additional Follow-up for Phone Call Additional follow up Details #1::        please call Vascular & [facility]in Clinic and find out what they need.  Additional Follow-up by: D. Thomos Lemons DO,  December 10, 2010 4:12 PM    Additional Follow-up for Phone Call Additional follow up Details #2::    call placed to [facility]  at [phone], spoke with [facility]. She states patient is not on file, and has not been seen at there faciltiy.  Call placed to patient at  [phone], he was asked to provide the name of the physician that he has seen with [facility], he states that he does not remember. He was advised to contact the office that is requesting the additional information, and call office back with the information needed.  Patient verbalized understanding and agrees as instructed. Glendell Docker CMA,  December 10, 2010 4:59 PM  Patient returned  call and left voice message stating the previous physician he was seen by was Dr Orpah Cobb. Attempted to locate physician provided by patient, no success. Call was returned to patient he was asked to provide the number that he has for the physician. He states that he will call back with the information Glendell Docker [facility]  December 11, 2010 2:58 PM    Additional Follow-up for Phone Call Additional follow up Details #3:: Details for Additional Follow-up Action Taken:  patient called back and left physician information. Call placed to Dr Shepard [facility], [phone], patient was last seen in June of 2011, spoke with Carollee Herter, she stated the patient is wanting to schedule a Endo Venous Laser Treatment. His insurance [employer]) requires the patient to be seen by his primary care provider, documentating the patient problems with his leg pain, and that he has been wearing compression stockings prior to treatment being approved. Once patient has attempted this and fail then they could proceed with the treament. Glendell Docker CMA  December 11, 2010 3:40 PM

## 2011-01-02 ENCOUNTER — Telehealth: Payer: Self-pay | Admitting: Internal Medicine

## 2011-01-03 ENCOUNTER — Encounter: Payer: Self-pay | Admitting: Internal Medicine

## 2011-01-03 ENCOUNTER — Ambulatory Visit (INDEPENDENT_AMBULATORY_CARE_PROVIDER_SITE_OTHER): Payer: BC Managed Care – PPO | Admitting: Internal Medicine

## 2011-01-03 DIAGNOSIS — R079 Chest pain, unspecified: Secondary | ICD-10-CM

## 2011-01-03 DIAGNOSIS — I10 Essential (primary) hypertension: Secondary | ICD-10-CM

## 2011-01-03 DIAGNOSIS — F411 Generalized anxiety disorder: Secondary | ICD-10-CM

## 2011-01-06 ENCOUNTER — Ambulatory Visit: Payer: Self-pay | Admitting: Cardiology

## 2011-01-09 NOTE — Procedures (Signed)
Summary: Endoscopy   EGD  Procedure date:  12/27/2010  Findings:      Location: [facility]   ENDOSCOPY PROCEDURE REPORT PATIENT:  Estell, Dillinger  MR#:  [id number] BIRTHDATE:   Apr 06, 1966, 44 yrs. old   GENDER:   male ENDOSCOPIST:   Judie Petit T. Russella Dar, MD, [facility]   PROCEDURE DATE:  12/30/2010 PROCEDURE:  EGD with dilatation over guidewire ASA CLASS:   Class II INDICATIONS: 1) dysphagia  2) GERD  MEDICATIONS:   Fentanyl 100 mcg IV, Versed 10 mg IV TOPICAL ANESTHETIC:   Exactacain Spray DESCRIPTION OF PROCEDURE:   After the risks benefits and alternatives of the procedure were thoroughly explained, informed consent was obtained.  The LB [id number] endoscope was introduced through the anus and advanced to the second portion of the duodenum, without limitations.  The instrument was slowly withdrawn as the mucosa was carefully examined. <<PROCEDUREIMAGES>>        <<OLD IMAGES>> The esophagus and gastroesophageal junction were completely normal in appearance.  The stomach was entered and closely examined. The pylorus, antrum, angularis, and lesser curvature were well visualized, including a retroflexed view of the cardia and fundus. The stomach wall was normally distensable. The scope passed easily through the pylorus into the duodenum. A hiatal hernia was found, small. The duodenal bulb was normal in appearance, as was the postbulbar duodenum.   Dilation was then performed at the total esophagus for dysphagia without a stricture:  1) Dilator:   Savary over guidewire   Size:   17 mm Resistance:   none   Heme:   none  COMPLICATIONS:   None  ENDOSCOPIC IMPRESSION:  1) Small hiatal hernia  RECOMMENDATIONS:  1) continue PPI and antireflux measures  2) post dilation instructions    Malcolm T. Russella Dar, MD, [facility]   CC: Thomos Lemons, DO

## 2011-01-09 NOTE — Progress Notes (Signed)
Summary: Alprazolam Refill  Phone Note Refill Request Message from:  Fax from Pharmacy on January 02, 2011 9:29 AM  Refills Requested: Medication #1:  ALPRAZOLAM 0.25 MG TABS one by mouth two times a day as needed   Dosage confirmed as above?Dosage Confirmed   Brand Name Necessary? No   Supply Requested: 1 month   Last Ref[facility]: [facility] [address] [place] [address] fax [phone]   Method Requested: Electronic Next Appointment Scheduled: none Initial call taken by: Elba Barman,  January 02, 2011 9:31 AM  Follow-up for Phone Call        refill x 1 only OV needed within 1 month Follow-up by: D. Thomos Lemons DO,  January 02, 2011 1:16 PM  Additional Follow-up for Phone Call Additional follow up Details #1::        Pt advised refill complete and need to schedule f/u within 1 month. Pt states he will be in town tomorrow. Appt scheduled for 10:45. Additional Follow-up by: Mervin Kung CMA Duncan Dull),  January 02, 2011 2:03 PM    Prescriptions: ALPRAZOLAM 0.25 MG TABS (ALPRAZOLAM) one by mouth two times a day as needed  #60 x 0   Entered by:   Mervin Kung CMA (AAMA)   Authorized by:   D. Thomos Lemons DO   Signed by:   Mervin Kung CMA (AAMA) on 01/02/2011   Method used:   Telephoned to ...       [facility]  [address] [phone]       [address]       [place], [place]  [address]       Ph: [phone] or [phone]       Fax: [phone]   RxID:   [id number]

## 2011-01-09 NOTE — Medication Information (Signed)
Summary: PPI certified in total/[employer]  PPI certified in total/[employer]   Imported By: Sherian Rein 01/01/2011 09:50:06  _____________________________________________________________________  External Attachment:    Type:   Image     Comment:   External Document
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
## 2011-02-26 LAB — BASIC METABOLIC PANEL
BUN: 20 mg/dL (ref 6–23)
Calcium: 8.8 mg/dL (ref 8.4–10.5)
GFR calc non Af Amer: >60 mL/min (ref >60)
Glucose, Bld: 145 mg/dL — ABNORMAL HIGH (ref 70–99)
Potassium: 3.4 mEq/L — ABNORMAL LOW (ref 3.5–5.1)
Sodium: 143 mEq/L (ref 135–145)

## 2011-02-26 LAB — CBC
HCT: 40.7 % (ref 39.0–52.0)
Hemoglobin: 14.3 g/dL (ref 13.0–17.0)
Platelets: 309 10*3/uL (ref 150–400)
RDW: 12.3 % (ref 11.5–15.5)
WBC: 7.3 10*3/uL (ref 4.0–10.5)

## 2011-02-26 LAB — DIFFERENTIAL
Basophils Absolute: 0 10*3/uL (ref 0.0–0.1)
Eosinophils Relative: 1 % (ref 0–5)
Lymphocytes Relative: 28 % (ref 12–46)
Lymphs Abs: 2 10*3/uL (ref 0.7–4.0)
Neutro Abs: 4.8 10*3/uL (ref 1.7–7.7)

## 2011-02-26 LAB — POCT CARDIAC MARKERS
CKMB, poc: <1 ng/mL — ABNORMAL LOW (ref 1.0–8.0)
Myoglobin, poc: 31 ng/mL (ref 12–200)
Troponin i, poc: <0.05 ng/mL (ref 0.00–0.09)

## 2011-02-27 ENCOUNTER — Other Ambulatory Visit: Payer: Self-pay | Admitting: *Deleted

## 2011-02-27 DIAGNOSIS — I10 Essential (primary) hypertension: Secondary | ICD-10-CM

## 2011-02-27 NOTE — Telephone Encounter (Signed)
Ok to refill x 5 

## 2011-02-27 NOTE — Telephone Encounter (Signed)
Patient called and left voice message [facility] [place]. He states he is out of his medication.

## 2011-03-03 ENCOUNTER — Telehealth: Payer: Self-pay | Admitting: Internal Medicine

## 2011-03-03 MED ORDER — LOSARTAN POTASSIUM 50 MG PO TABS: 50.0000 mg | ORAL_TABLET | Freq: Every day | ORAL | Status: DC

## 2011-03-03 NOTE — Telephone Encounter (Signed)
Call placed to pharmacy at  [phone], spoke with [facility]. He has verified that patient has a rx filled and awaiting pick up for Losartan Potassium. Called placed to patient at  [phone], no answer, a voice message was left advising patient to check with pharmacy for medication refill.

## 2011-03-03 NOTE — Telephone Encounter (Signed)
Refill- losartan potassium 50mg  tab. Take 1 tablet by mouth every day. Qty 30. Last fill 3.1.12

## 2011-03-03 NOTE — Telephone Encounter (Signed)
Call placed to patient at [phone], no answer. A detailed voice message was left informing patient of refill to pharmacy

## 2011-03-17 ENCOUNTER — Ambulatory Visit: Payer: BC Managed Care – PPO | Admitting: Family

## 2011-03-17 ENCOUNTER — Ambulatory Visit (INDEPENDENT_AMBULATORY_CARE_PROVIDER_SITE_OTHER): Payer: BC Managed Care – PPO | Admitting: Family

## 2011-03-17 ENCOUNTER — Encounter: Payer: Self-pay | Admitting: Family

## 2011-03-17 VITALS — BP 132/100 | HR 78 | Temp 98.1°F | Resp 18 | Ht 75.5 in | Wt 255.1 lb

## 2011-03-17 DIAGNOSIS — F411 Generalized anxiety disorder: Secondary | ICD-10-CM

## 2011-03-17 DIAGNOSIS — I10 Essential (primary) hypertension: Secondary | ICD-10-CM

## 2011-03-17 DIAGNOSIS — R635 Abnormal weight gain: Secondary | ICD-10-CM

## 2011-03-17 LAB — BASIC METABOLIC PANEL
Calcium: 9.7 mg/dL (ref 8.4–10.5)
Creat: 1.06 mg/dL (ref 0.40–1.50)

## 2011-03-17 LAB — TSH: TSH: 3.695 u[IU]/mL (ref 0.350–4.500)

## 2011-03-17 MED ORDER — HYDROCHLOROTHIAZIDE 25 MG PO TABS: 25.0000 mg | ORAL_TABLET | Freq: Every day | ORAL | Status: DC
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
## 2011-03-17 NOTE — Patient Instructions (Addendum)
Please complete your lab work on the first floor. Follow up with Dr. Artist Pais in 1 month.

## 2011-03-17 NOTE — Assessment & Plan Note (Signed)
BP is not at goal.  Will add HCTZ and check BMET.  Pt instructed to f/u in 1 month.

## 2011-03-17 NOTE — Assessment & Plan Note (Signed)
Feels nervous at times, energy is low, c/o weight gain.  Will check TSH.  Continue fluoxetine.

## 2011-03-18 ENCOUNTER — Encounter: Payer: Self-pay | Admitting: Family

## 2011-03-20 ENCOUNTER — Encounter: Payer: Self-pay | Admitting: Internal Medicine

## 2011-03-20 ENCOUNTER — Ambulatory Visit: Payer: BC Managed Care – PPO | Admitting: Internal Medicine

## 2011-03-20 DIAGNOSIS — Z0289 Encounter for other administrative examinations: Secondary | ICD-10-CM

## 2011-03-21 ENCOUNTER — Encounter: Payer: Self-pay | Admitting: Internal Medicine

## 2011-03-21 ENCOUNTER — Ambulatory Visit (INDEPENDENT_AMBULATORY_CARE_PROVIDER_SITE_OTHER): Payer: BC Managed Care – PPO | Admitting: Internal Medicine

## 2011-03-21 DIAGNOSIS — F411 Generalized anxiety disorder: Secondary | ICD-10-CM

## 2011-03-21 DIAGNOSIS — G8929 Other chronic pain: Secondary | ICD-10-CM

## 2011-03-21 DIAGNOSIS — R51 Headache: Secondary | ICD-10-CM

## 2011-03-21 DIAGNOSIS — R232 Flushing: Secondary | ICD-10-CM

## 2011-03-21 DIAGNOSIS — I10 Essential (primary) hypertension: Secondary | ICD-10-CM

## 2011-03-21 MED ORDER — AMITRIPTYLINE HCL 10 MG PO TABS: 10.0000 mg | ORAL_TABLET | Freq: Every day | ORAL | Status: DC
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
## 2011-04-09 ENCOUNTER — Telehealth: Payer: Self-pay | Admitting: Internal Medicine

## 2011-04-09 NOTE — Telephone Encounter (Signed)
Pt would like results for urine test.

## 2011-04-09 NOTE — Telephone Encounter (Signed)
The only thing I see in chart is an order for 24 hour urine from back in the end of April. Could you pls call [facility] and see if this has resulted?

## 2011-04-09 NOTE — Telephone Encounter (Signed)
Call placed to patient at [phone], no answer. A detailed voice message was left for patient to return phone call for clarification on test results requested
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
## 2011-04-11 NOTE — Telephone Encounter (Signed)
Call placed to patient at [phone], he was advised per Dr Artist Pais 24 hour test normal

## 2011-04-20 NOTE — Assessment & Plan Note (Addendum)
BP is improved.  Reduce hctz dose to 12.5 mg.  Pt having intermittent flushing, headaches and arms tingling sensation. Rule out pheo and carcinoid.  Obtain 24 hr urine studies BP: 110/70 mmHg

## 2011-04-29 ENCOUNTER — Encounter: Payer: Self-pay | Admitting: Family Medicine

## 2011-04-29 ENCOUNTER — Ambulatory Visit (INDEPENDENT_AMBULATORY_CARE_PROVIDER_SITE_OTHER): Payer: BC Managed Care – PPO | Admitting: Family Medicine

## 2011-04-29 ENCOUNTER — Other Ambulatory Visit: Payer: Self-pay | Admitting: Family Medicine

## 2011-04-29 DIAGNOSIS — R002 Palpitations: Secondary | ICD-10-CM

## 2011-04-29 DIAGNOSIS — I10 Essential (primary) hypertension: Secondary | ICD-10-CM

## 2011-04-29 DIAGNOSIS — R42 Dizziness and giddiness: Secondary | ICD-10-CM

## 2011-04-29 NOTE — Progress Notes (Signed)
OFFICE VISIT  04/29/2011   CC:  Chief Complaint  Patient presents with  . Other    low blood pressure-x 3 epsiodes     HPI:    Patient is a 45 y.o. Caucasian male who presents for presyncope. Says this has been recurrent for about 14mo.  Yesterday was working on a rooftop, suddenly felt woozy/lightheaded, general weakness, arms tingly, sat down and felt better in a few minutes.  Also has noted low bp (90/50 or so) and mild HR increase (130s) at times when he feels like this.  No actual syncope occurs.  No vertigo.  No known precipitating factors, no known alleviating factors.  Gets palpitations/feels fast heartbeat sometimes but not consistently tied to his presyncopal spells.  Home bp checks when pt asymptomatic show 120s/80s per his report today.   Normal PO intake lately. Says he feels like "somethings not right". Had ETT by Dr. Myrtis Ser 12/26/10 that was negative, echo neg at that time, CT chest neg 11/2010. TSH normal x 2 this year.  Past Medical History  Diagnosis Date  . GERD (gastroesophageal reflux disease)   . Anxiety   . Neck pain   . Tobacco abuse     history of  . Clavicle fracture 04/2008    left comminuted clavicle fracture (complicated by wound infection)  . Cervical disc disease   . Varicose veins   . Gastritis   . Chest pain     treadmill 12/17/10--no ischemia and no arrhythmia  . Palpitations   . Pre-syncope     Echo and treadmill 12/17/10-- no further workup. EF 60%.  Diastolic dysfunction  . Diastolic dysfunction 12/17/10    by Echo    Past Surgical History  Procedure Date  . Fracture surgery 04/2008    ORIF left clavicle using Katrinka Blazing and Matthew anatomic clavicle plate. Plate later removed.    Outpatient Prescriptions Prior to Visit  Medication Sig Dispense Refill  . dexlansoprazole (DEXILANT) 60 MG capsule Take 60 mg by mouth every morning.        . hydrochlorothiazide 25 MG tablet Take 0.5 tablets (12.5 mg total) by mouth daily.  30 tablet  11  . losartan  (COZAAR) 50 MG tablet Take 1 tablet (50 mg total) by mouth daily.  30 tablet  5  . amitriptyline (ELAVIL) 10 MG tablet Take 1 tablet (10 mg total) by mouth at bedtime.  30 tablet  2  . FLUoxetine (PROZAC) 10 MG tablet Take 10 mg by mouth daily.          No Known Allergies  ROS As per HPI  PE: Blood pressure 100/70, pulse 88, temperature 98.2 F (36.8 C), temperature source Oral, resp. rate 20, weight 258 lb (117.028 kg), SpO2 97.00%. Gen: Alert, well appearing.  Patient is oriented to person, place, time, and situation. Neck: supple, ROM full.  Carotids 2+ bilat, without bruit.  No lymphadenopathy, thyromegaly, or mass. Chest: symmetric expansion, nonlabored respirations.  Clear and equal breath sounds in all lung fields.   CV: RRR, no m/r/g.  Peripheral pulses 2+ and symmetric. Ext: no c/c/e.  Radial pulses symmetric, no delay.  LABS:  none  IMPRESSION AND PLAN:  Dizziness With episodes of palpitations that are not clearly related. Episodic. Need to r/o dysrhythmia so we'll do 48h holter monitoring. I'll also check BMET today.   Continue current antihypertensive regimen.     FOLLOW UP: Return in about 3 weeks (around 05/20/2011).

## 2011-04-29 NOTE — Assessment & Plan Note (Signed)
With episodes of palpitations that are not clearly related. Episodic. Need to r/o dysrhythmia so we'll do 48h holter monitoring. I'll also check BMET today.   Continue current antihypertensive regimen.

## 2011-04-30 LAB — BASIC METABOLIC PANEL
Potassium: 4 mEq/L (ref 3.5–5.3)
Sodium: 138 mEq/L (ref 135–145)

## 2011-04-30 NOTE — Progress Notes (Signed)
Quick Note:  Please notify patient: his labs yesterday were all normal except his kidney function test showed that he's slightly dehydrated. Tell him this happens easier in the summer and sometimes we don't even realize it. Plus, this happens easier in a person who is taking a pill like HCTZ, which makes you pee out more water. I recommend he drink 6-8 large cups of water daily (1/2 gatorade 1/2 water mix is good, too), maybe more if he's working out in the heat. I also recommend he take only 1/2 tab of HCTZ daily instead of the whole tab.  I still want him to do the Holter monitor like we discussed. Thx--PM ______

## 2011-05-02 ENCOUNTER — Encounter (INDEPENDENT_AMBULATORY_CARE_PROVIDER_SITE_OTHER): Payer: BC Managed Care – PPO

## 2011-05-02 DIAGNOSIS — R002 Palpitations: Secondary | ICD-10-CM

## 2011-05-21 ENCOUNTER — Ambulatory Visit: Payer: BC Managed Care – PPO | Admitting: Internal Medicine

## 2011-06-09 ENCOUNTER — Ambulatory Visit (INDEPENDENT_AMBULATORY_CARE_PROVIDER_SITE_OTHER): Payer: BC Managed Care – PPO | Admitting: Internal Medicine

## 2011-06-09 ENCOUNTER — Encounter: Payer: Self-pay | Admitting: Internal Medicine

## 2011-06-09 VITALS — BP 120/82 | HR 80 | Ht 75.5 in | Wt 260.0 lb

## 2011-06-09 DIAGNOSIS — R0609 Other forms of dyspnea: Secondary | ICD-10-CM

## 2011-06-09 DIAGNOSIS — I10 Essential (primary) hypertension: Secondary | ICD-10-CM

## 2011-06-09 DIAGNOSIS — R0989 Other specified symptoms and signs involving the circulatory and respiratory systems: Secondary | ICD-10-CM

## 2011-06-09 DIAGNOSIS — G4700 Insomnia, unspecified: Secondary | ICD-10-CM

## 2011-06-09 DIAGNOSIS — R0600 Dyspnea, unspecified: Secondary | ICD-10-CM

## 2011-06-09 MED ORDER — ZOLPIDEM TARTRATE 5 MG PO TABS: 5.0000 mg | ORAL_TABLET | Freq: Every evening | ORAL | Status: DC | PRN

## 2011-06-15 ENCOUNTER — Other Ambulatory Visit: Payer: Self-pay

## 2011-06-15 ENCOUNTER — Emergency Department (HOSPITAL_BASED_OUTPATIENT_CLINIC_OR_DEPARTMENT_OTHER)
Admission: EM | Admit: 2011-06-15 | Discharge: 2011-06-15 | Disposition: A | Discharge status: Home / Self Care | Payer: BC Managed Care – PPO | Attending: Emergency Medicine | Admitting: Emergency Medicine

## 2011-06-15 ENCOUNTER — Encounter (HOSPITAL_BASED_OUTPATIENT_CLINIC_OR_DEPARTMENT_OTHER): Payer: Self-pay

## 2011-06-15 DIAGNOSIS — R51 Headache: Secondary | ICD-10-CM | POA: Insufficient documentation

## 2011-06-15 DIAGNOSIS — K219 Gastro-esophageal reflux disease without esophagitis: Secondary | ICD-10-CM | POA: Insufficient documentation

## 2011-06-15 DIAGNOSIS — R0600 Dyspnea, unspecified: Secondary | ICD-10-CM | POA: Insufficient documentation

## 2011-06-15 DIAGNOSIS — L03119 Cellulitis of unspecified part of limb: Secondary | ICD-10-CM | POA: Insufficient documentation

## 2011-06-15 DIAGNOSIS — F411 Generalized anxiety disorder: Secondary | ICD-10-CM | POA: Insufficient documentation

## 2011-06-15 DIAGNOSIS — R531 Weakness: Secondary | ICD-10-CM

## 2011-06-15 DIAGNOSIS — I10 Essential (primary) hypertension: Secondary | ICD-10-CM | POA: Insufficient documentation

## 2011-06-15 DIAGNOSIS — L02419 Cutaneous abscess of limb, unspecified: Secondary | ICD-10-CM

## 2011-06-15 LAB — BASIC METABOLIC PANEL
CO2: 23 mEq/L (ref 19–32)
Calcium: 8.6 mg/dL (ref 8.4–10.5)
Creatinine, Ser: 0.9 mg/dL (ref 0.50–1.35)
Glucose, Bld: 130 mg/dL — ABNORMAL HIGH (ref 70–99)

## 2011-06-15 LAB — CK TOTAL AND CKMB (NOT AT ARMC)
CK, MB: 1.1 ng/mL (ref 0.3–4.0)
Relative Index: INVALID (ref 0.0–2.5)
Total CK: 48 U/L (ref 7–232)

## 2011-06-15 LAB — BASIC METABOLIC PANEL WITH GFR
BUN: 23 mg/dL (ref 6–23)
Chloride: 108 meq/L (ref 96–112)
GFR calc Af Amer: >60 mL/min (ref >60)
GFR calc non Af Amer: >60 mL/min (ref >60)
Potassium: 3.5 meq/L (ref 3.5–5.1)
Sodium: 141 meq/L (ref 135–145)

## 2011-06-15 LAB — CBC
HCT: 35.7 % — ABNORMAL LOW (ref 39.0–52.0)
Hemoglobin: 13 g/dL (ref 13.0–17.0)
MCH: 33.4 pg (ref 26.0–34.0)
MCHC: 36.4 g/dL — ABNORMAL HIGH (ref 30.0–36.0)
MCV: 91.8 fL (ref 78.0–100.0)
Platelets: 245 K/uL (ref 150–400)
RBC: 3.89 MIL/uL — ABNORMAL LOW (ref 4.22–5.81)
RDW: 12.7 % (ref 11.5–15.5)
WBC: 7.7 K/uL (ref 4.0–10.5)

## 2011-06-15 LAB — TROPONIN I: Troponin I: <0.3 ng/mL (ref <0.30)

## 2011-06-15 MED ORDER — SODIUM CHLORIDE 0.9 % IV BOLUS (SEPSIS)
1000.0000 mL | Freq: Once | INTRAVENOUS | Status: AC
  Administered 2011-06-15: 1000 mL via INTRAVENOUS

## 2011-06-15 MED ORDER — CLINDAMYCIN HCL 300 MG PO CAPS: 300.0000 mg | ORAL_CAPSULE | Freq: Three times a day (TID) | ORAL | Status: AC

## 2011-06-15 MED ORDER — ONDANSETRON HCL 4 MG/2ML IJ SOLN
INTRAMUSCULAR | Status: AC
  Administered 2011-06-15: 4 mg via INTRAVENOUS
  Filled 2011-06-15: qty 2

## 2011-06-15 MED ORDER — ONDANSETRON HCL 4 MG/2ML IJ SOLN
4.0000 mg | Freq: Once | INTRAMUSCULAR | Status: AC
  Administered 2011-06-15: 4 mg via INTRAVENOUS

## 2011-06-15 NOTE — Assessment & Plan Note (Signed)
Off SSRI. Begin low-dose Ambien p.r.n.

## 2011-06-15 NOTE — Assessment & Plan Note (Signed)
Pulmonary consult for consideration of possible sleep apnea

## 2011-06-15 NOTE — ED Notes (Signed)
Pt states that he was having insomnia and that he "doesn't feel right".  Pt states that he he a procedure on Friday at [facility] for a blood clot.  Pt was told that he should come to the ER if he didn't feel well or felt there were complications.  Pt presents this morning stating that he feels like his blood pressure is too high.

## 2011-06-15 NOTE — Assessment & Plan Note (Signed)
Suspect mild volume depletion with hydrochlorothiazide. Stop HCTZ. Monitor blood pressure as an outpatient and followup in clinic as scheduled

## 2011-06-15 NOTE — Discharge Instructions (Signed)
 Abscess/Boil (Furuncle) An abscess (boil or furuncle) is an infected area that contains a collection of pus.  SYMPTOMS Signs and symptoms of an abscess include pain, tenderness, redness, or hardness. You may feel a moveable soft area under your skin. An abscess can occur anywhere in the body.  TREATMENT An incision (cut by the caregiver) may have been made over your abscess so the pus could be drained out. Gauze may have been packed into the space or a drain may have been looped thru the abscess cavity (pocket). This provides a drain that will allow the cavity to heal from the inside outwards. The abscess may be painful for a few days, but should feel much better if it was drained. Your abscess, if seen early, may not have localized and may not have been drained. If not, another appointment may be required if it does not get better on its own or with medications. HOME CARE INSTRUCTIONS  Only take over-the-counter or prescription medicines for pain, discomfort, or fever as directed by your caregiver.   Keep the skin and clothes clean around your abscess.   If the abscess was drained, you will need to use gauze dressing ("4x4") to collect any draining pus. These dressing typically will need to be changed 3 or more times during the day.   The infection may spread by skin contact with others. Avoid skin contact as much as possible.   Good hygiene is very important including regular hand washing, cover any draining skin lesions, and don't share personal care items.   If you participate in sports do not share athletic equipment, towels, whirlpools, or personal care items. Shower after every practice or tournament.   If a draining area cannot be adequately covered:   Do not participate in sports   Children should not participate in day care until the wound has healed or drainage stops.   If your caregiver has given you a follow-up appointment, it is very important to keep that appointment. Not  keeping the appointment could result in a much worse infection, chronic or permanent injury, pain, and disability. If there is any problem keeping the appointment, you must call back to this facility for assistance.  SEEK MEDICAL CARE IF:  You develop increased pain, swelling, redness, drainage, or bleeding in the wound site.   You develop signs of generalized infection including muscle aches, chills, fever, or a general ill feeling.   You or your child has an oral temperature above 102 not relieved with medications.    See your caregiver as directed for a recheck or sooner if you develop any of the symptoms described above. Take antibiotics (medicine that kills germs) as directed if they were prescribed. MAKE SURE YOU:   Understand these instructions.   Will watch your condition.   Will get help right away if you are not doing well or get worse.  Document Released: 08/20/2005 Document Re-Released: 04/30/2010 ExitCare Patient Information 2011 [place], [place].   ED's provide medical screening exams and initial stabilizing treatment of emergency medical conditions.  Medicine is an Pharmacologist and many conditions cannot be diagnosed or completely treated during a single ED visit.  Your treating healthcare provider(s) today feel your condition has been stabilized so further care as an outpatient is reasonable.  Emergency care does not substitute for complete, ongoing, or follow-up care by your primary care physician or consultant.    Your medication list was reviewed prior to treatment, and at discharge, by the treating provider for  the purpose of this outpatient visit only.  Please review this entire medication list with your pharmacist, primary care physician, and specialist(s).  It is your responsibility to share any new medication instructions you received this visit with your doctor(s).  Although no medicine is without risk, your healthcare provider today feels reasonable decisions were  made concerning starting new medications and stopping or changing the dosages of your usual medications until you receive follow-up care.  Take medications only as directed.  Many medications can cause drowsiness, especially those for pain, anxiety, muscle spasms, nausea, and allergies.  DO NOT drive, drink alcohol, operate power machinery, or participate in potentially dangerous activities if taking medicines that make you tired.  Chronic pain is best managed by pain specialists or primary care physicians, so narcotic refills are not routinely dispensed in the ED.  DO NOT take multiple medications containing acetaminophen  (Tylenol ), such as many narcotic drug combinations and over-the-counter cold medicines.

## 2011-06-15 NOTE — ED Provider Notes (Signed)
History     Chief Complaint  Patient presents with  . Hypertension   HPI Comments: He reports waking up around 2 hours ago and generally feeling bad.  Has a HA to left side of head which is similar to priors.  He has numbness and slight grip weakness to left hand which he also has had before attributing to chronic neck problem, reports he was supposed to have surgery years ago, but had put it off.  He denies blurred vision, neck pain, slurred speech, facial droop.  No CP, SOB, slight nausea without vomiting.  No abd pain.  He had a superficial blood clot removed at [facility] on Friday from left leg.  Dressings were still in place.  He has had sig soreness and bruising to leg since then but able to walk.  Denies any off balance or staggering gait.  No recent diarrhea.  Was told to stop taking a fluid pill because his Cr was abnormal.  He took a valium PTA which he [facility] from [facility] to take as a pre-op sedation and still had a few left.  He feels improved but still not well.  At home his BP using his own machine was 150/130.    Patient is a 45 y.o. male presenting with hypertension. The history is provided by the patient.  Hypertension    Past Medical History  Diagnosis Date  . GERD (gastroesophageal reflux disease)   . Anxiety   . Neck pain   . Tobacco abuse     history of  . Clavicle fracture 04/2008    left comminuted clavicle fracture (complicated by wound infection)  . Cervical disc disease   . Varicose veins   . Gastritis   . Chest pain     treadmill 12/17/10--no ischemia and no arrhythmia  . Palpitations   . Pre-syncope     Echo and treadmill 12/17/10-- no further workup. EF 60%.  Diastolic dysfunction  . Diastolic dysfunction 12/17/10    by Echo    Past Surgical History  Procedure Date  . Fracture surgery 04/2008    ORIF left clavicle using Katrinka Blazing and Matthew anatomic clavicle plate. Plate later removed.  . Repair of clavical     Family History  Problem Relation  Age of Onset  . Cancer Mother     uterine  . Thyroid disease Mother   . Stroke Father   . Diabetes Other   . Heart disease Other   . Cancer Other     colon cancer    History  Substance Use Topics  . Smoking status: Former Smoker    Quit date: 08/05/2000  . Smokeless tobacco: Never Used  . Alcohol Use: No      Review of Systems  All other systems reviewed and are negative.    Physical Exam  BP 144/91  Pulse 94  Temp(Src) 97.5 F (36.4 C) (Oral)  Resp 16  Ht 6\' 3"  (1.905 m)  Wt 250 lb (113.399 kg)  BMI 31.25 kg/m2  SpO2 97%  Physical Exam  Constitutional: He is oriented to person, place, and time. He appears well-developed and well-nourished. No distress.  HENT:  Head: Normocephalic and atraumatic.  Eyes: Pupils are equal, round, and reactive to light.  Neck: Normal range of motion. Neck supple.  Cardiovascular: Normal rate and regular rhythm.   Pulmonary/Chest: Effort normal.  Abdominal: Soft. He exhibits no distension. There is no tenderness. There is no rebound.  Musculoskeletal:  Legs: Neurological: He is alert and oriented to person, place, and time. He displays no atrophy and no tremor. No cranial nerve deficit. He exhibits normal muscle tone.       Normal finger to nose, no arm drift, no facial droop, CN 2-12 intact, no speech slurring  Skin: Skin is warm.    ED Course  Procedures  MDM Pt was pale and somewhat clammy per RN at triage.  ECG is ok here.  NSR at rate 88, normal intervals, no ST changes.  Unchanged from 12/07/10.  Pt's old records indicate a neg stress test in Jan 2012.  At this point, no focal findigns of stroke, BP is improved here and pt states feels better.  Never had CP or SOB, no pleurisy here so I doubt PE or ACS.  Will get 1 set of markers.  Given neg stress recently, that is reassuring.  Pt has a small abscess, possibly due to ingrown hair from ACE bandages being on leg for 2 days.  Draining slightly, but too small for I&D at  this time, will put on some oral clindamycin.  Will monitor here, give a bolus of IVF's, check renal function on labs as well.      5:57 AM Pt feels improved, ok going home.  Turns out he has had similar episodes of waking in middle of night sweaty and similar symptoms.  He is being referred for sleep study as well and PCP is aware of these episodes.  He has follow up with [facility] on Monday and can be rechecked.  Lab results discussed with pt and brother.  Gavin Pound. Oletta Lamas, MD 06/15/11 [id number]

## 2011-06-15 NOTE — Progress Notes (Signed)
  Subjective:    Patient ID: Raymond Mathews, male    DOB: 10-08-1966, 45 y.o.   MRN: [id number]  HPI Patient presents to clinic for evaluation of hypertension. Has had difficulty with his hypertension with intermittent low blood pressures. Has had episodes of presyncope without syncope. Also associated palpitations and is scheduled for Holter monitor. His symptoms often occur working outside in the heat. Has recently reduced Hydrocort thiazide dose in half and may notice some mild improvement. Suffers from chronic insomnia and feels the Prozac does not help. Prozac subsequently was stopped the patient without ill effects.  Does have history of PND with no witnessed apnea. No significant daytime somnolence or fatigue. No other complaints  Reviewed past medical history, medications and allergies  Review of Systems  See history of present illness     Objective:   Physical Exam  Nursing note and vitals reviewed. Constitutional: He appears well-developed and well-nourished. No distress.  HENT:  Head: Normocephalic and atraumatic.  Right Ear: External ear normal.  Left Ear: External ear normal.  Eyes: Conjunctivae are normal. No scleral icterus.  Neurological: He is alert.  Skin: He is not diaphoretic.  Psychiatric: He has a normal mood and affect.          Assessment & Plan:

## 2011-06-17 ENCOUNTER — Ambulatory Visit (INDEPENDENT_AMBULATORY_CARE_PROVIDER_SITE_OTHER): Payer: BC Managed Care – PPO | Admitting: Family

## 2011-06-17 ENCOUNTER — Encounter: Payer: Self-pay | Admitting: Family

## 2011-06-17 ENCOUNTER — Telehealth: Payer: Self-pay | Admitting: *Deleted

## 2011-06-17 VITALS — BP 120/86 | HR 78 | Temp 97.7°F | Resp 16 | Ht 75.5 in | Wt 260.0 lb

## 2011-06-17 DIAGNOSIS — IMO0002 Reserved for concepts with insufficient information to code with codable children: Secondary | ICD-10-CM

## 2011-06-17 DIAGNOSIS — L0291 Cutaneous abscess, unspecified: Secondary | ICD-10-CM

## 2011-06-17 DIAGNOSIS — L02419 Cutaneous abscess of limb, unspecified: Secondary | ICD-10-CM

## 2011-06-17 NOTE — Patient Instructions (Signed)
Please continue both bactrim and clindamycin.  Follow up on Friday of this week.   Call if you develop increasing redness, fever, drainage or pain.

## 2011-06-17 NOTE — Progress Notes (Signed)
Subjective:    Patient ID: Raymond Mathews, male    DOB: [birth year], 45 y.o.   MRN: [id number]  HPI  Raymond Mathews is a 44 year old male who presents today with chief complaint of redness behind his left knee.He underwent vein surgery which was performed on Friday July 20. He noticed Saturday night July 21, some pain behind his left knee.  At 3:30 AM Sunday AM he woke up feeling "crazy and weird."  He went [facility] the [facility] ED. He was prescribed clindamycin.  He saw Dr. Luana Shu partner yesterday and Doylene Bode was added.  He denies fever.  Feels that the redness overall has improved.  He tells me that an ultrasound was performed by the vascular physician.  The patient reports previous history of MRSA infection. He denies fever. He notes mild drainage from the site behind his left knee.  Review of Systems See history of present illness  Past Medical History  Diagnosis Date  . GERD (gastroesophageal reflux disease)   . Anxiety   . Neck pain   . Tobacco abuse     history of  . Clavicle fracture 04/2008    left comminuted clavicle fracture (complicated by wound infection)  . Cervical disc disease   . Varicose veins   . Gastritis   . Chest pain     treadmill 12/17/10--no ischemia and no arrhythmia  . Palpitations   . Pre-syncope     Echo and treadmill 12/17/10-- no further workup. EF 60%.  Diastolic dysfunction  . Diastolic dysfunction 12/17/10    by Echo    History   Social History  . Marital Status: Single    Spouse Name: N/A    Number of Children: 2  . Years of Education: N/A   Occupational History  . OWNER     Holiday representative   Social History Main Topics  . Smoking status: Former Smoker    Quit date: 08/05/2000  . Smokeless tobacco: Never Used  . Alcohol Use: No  . Drug Use: No  . Sexually Active: Not on file   Other Topics Concern  . Not on file   Social History Narrative   Single  has two boysOccupation:  Self-employed in Met[employer] Smoker quit in  2001Alcohol use-no Drug use-no   Poor diet - fast food Daily Caffeine Use-coffee  1 cup coffee daily.      Past Surgical History  Procedure Date  . Fracture surgery 04/2008    ORIF left clavicle using Katrinka Blazing and Matthew anatomic clavicle plate. Plate later removed.  . Repair of clavical     Family History  Problem Relation Age of Onset  . Cancer Mother     uterine  . Thyroid disease Mother   . Stroke Father   . Diabetes Other   . Heart disease Other   . Cancer Other     colon cancer    No Known Allergies  Current Outpatient Prescriptions on File Prior to Visit  Medication Sig Dispense Refill  . clindamycin (CLEOCIN) 300 MG capsule Take 1 capsule (300 mg total) by mouth 3 (three) times daily.  30 capsule  0  . dexlansoprazole (DEXILANT) 60 MG capsule Take 60 mg by mouth every morning.        Marland Kitchen losartan (COZAAR) 50 MG tablet Take 1 tablet (50 mg total) by mouth daily.  30 tablet  5  . zolpidem (AMBIEN) 5 MG tablet Take 1 tablet (5 mg total) by mouth at bedtime as needed for sleep.  30 tablet  0    BP 120/86  Pulse 78  Temp(Src) 97.7 F (36.5 C) (Oral)  Resp 16  Ht 6' 3.5" (1.918 m)  Wt 260 lb (117.935 kg)  BMI 32.07 kg/m2       Objective:   Physical Exam  Constitutional: He appears well-developed and well-nourished.  Skin:          Firm area of induration behind the left knee. Approximate diameters 2 inches. There is a small opening in the center which is draining purulent drainage. There is no fluctuance. There is mild surrounding erythema. Erythema appears to be significantly improved as compared to the line which was drawn yesterday by the vascular surgeon.          Assessment & Plan:

## 2011-06-17 NOTE — Telephone Encounter (Signed)
Patient made appt for this afternoon. Patient states that he did see vein specialist today but the specialist told him to make appt with [place] today also to have Melissa look at infection.

## 2011-06-17 NOTE — Telephone Encounter (Signed)
Received call from pt stating he had laser surgery on a vein in his leg last week. Now has infection at that site. Was prescribed Septra by vein specialist. Went to ER for worsening symptoms this weekend and was prescribed Clindamycin. Not sure if he should take both meds together. Wound is draining. Advised pt to call his vein specialist for further assessment. Pt voices understanding.

## 2011-06-17 NOTE — Assessment & Plan Note (Signed)
45 year old male with abscess of the left lower extremity. This is following recent surgery. The area which is infected however was not overlying one of the incisions. It was an area where there had previously been some tape. Possible that the tape caused some skin breakdown and surface a portal of entry for probable MRSA infection. Overall, it appears to be improving as compared to line which was drawn yesterday by vascular surgeon. He is currently taking Septra but had stopped the clindamycin. I have advised him to continue both of these medications. I drew a line today around the area of erythema and have asked the patient to keep a close eye on the abscess and surrounding erythema, and contact us should the abscess increases in size or if the erythema spreads. I advised him to place warm compresses to the affected area twice daily to promote drainage. I do not see indication at this time for incision and drainage as there is no fluctuance. We'll plan to see the patient back for close followup on Friday 7/27, skin her symptoms worsen or if he develops fever. Patient verbalizes understanding. Drainage was sent for culture today.

## 2011-06-18 ENCOUNTER — Telehealth: Payer: Self-pay | Admitting: Family

## 2011-06-18 NOTE — Telephone Encounter (Signed)
Called patient to follow up.  No answer, left message requesting that pt call us to let us know how his leg is doing.

## 2011-06-19 NOTE — Telephone Encounter (Signed)
Pt returned my call and reports that his leg is getting better. Advised pt to call us back if symptoms return or get worse. Pt voices understanding.

## 2011-06-19 NOTE — Telephone Encounter (Signed)
Left message on machine to return my call. 

## 2011-06-20 ENCOUNTER — Ambulatory Visit: Payer: BC Managed Care – PPO | Admitting: Family

## 2011-06-20 LAB — WOUND CULTURE: Gram Stain: NONE SEEN

## 2011-06-23 ENCOUNTER — Ambulatory Visit (INDEPENDENT_AMBULATORY_CARE_PROVIDER_SITE_OTHER): Payer: BC Managed Care – PPO | Admitting: Family

## 2011-06-23 ENCOUNTER — Encounter: Payer: Self-pay | Admitting: Family

## 2011-06-23 DIAGNOSIS — R739 Hyperglycemia, unspecified: Secondary | ICD-10-CM

## 2011-06-23 DIAGNOSIS — R7309 Other abnormal glucose: Secondary | ICD-10-CM

## 2011-06-23 DIAGNOSIS — R5383 Other fatigue: Secondary | ICD-10-CM | POA: Insufficient documentation

## 2011-06-23 DIAGNOSIS — R7989 Other specified abnormal findings of blood chemistry: Secondary | ICD-10-CM

## 2011-06-23 DIAGNOSIS — L03119 Cellulitis of unspecified part of limb: Secondary | ICD-10-CM

## 2011-06-23 DIAGNOSIS — L02419 Cutaneous abscess of limb, unspecified: Secondary | ICD-10-CM

## 2011-06-23 DIAGNOSIS — IMO0002 Reserved for concepts with insufficient information to code with codable children: Secondary | ICD-10-CM

## 2011-06-23 DIAGNOSIS — R5381 Other malaise: Secondary | ICD-10-CM

## 2011-06-23 LAB — HEPATIC FUNCTION PANEL
ALT: 17 U/L (ref 0–53)
AST: 15 U/L (ref 0–37)
Albumin: 4.6 g/dL (ref 3.5–5.2)

## 2011-06-23 LAB — HEMOGLOBIN A1C: Hgb A1c MFr Bld: 5.5 % (ref <5.7)

## 2011-06-23 MED ORDER — NEBIVOLOL HCL 5 MG PO TABS: 5.0000 mg | ORAL_TABLET | Freq: Every day | ORAL | Status: DC

## 2011-06-23 NOTE — Progress Notes (Signed)
Subjective:    Patient ID: Raymond Mathews, male    DOB: 1966-01-23, 45 y.o.   MRN: [id number]  HPI  1.  Abscess- MRSA- he continues abx on day #9/10.  Notes near resolution of the abscess.  2.  Fatigue- symptoms started after he started Losartan.  Some trouble focusing, feels like his eyes are glassy.  He is followed by an eye doctor.  Feels "swimmy headed consantly." feels shakey and clammy.  + night sweats- "months."  Denies fever, but does have occasional swollen glands.  Denies cough except rarely due to reflux.  Occasional nausea, no vomitting.  +anorexia.  +frequent palpitations- even at rest.  Never tested for HIV.       Review of Systems See HPI  Past Medical History  Diagnosis Date  . GERD (gastroesophageal reflux disease)   . Anxiety   . Neck pain   . Tobacco abuse     history of  . Clavicle fracture 04/2008    left comminuted clavicle fracture (complicated by wound infection)  . Cervical disc disease   . Varicose veins   . Gastritis   . Chest pain     treadmill 12/17/10--no ischemia and no arrhythmia  . Palpitations   . Pre-syncope     Echo and treadmill 12/17/10-- no further workup. EF 60%.  Diastolic dysfunction  . Diastolic dysfunction 12/17/10    by Echo    History   Social History  . Marital Status: Single    Spouse Name: N/A    Number of Children: 2  . Years of Education: N/A   Occupational History  . OWNER     Holiday representative   Social History Main Topics  . Smoking status: Former Smoker    Quit date: 08/05/2000  . Smokeless tobacco: Never Used  . Alcohol Use: No  . Drug Use: No  . Sexually Active: Not on file   Other Topics Concern  . Not on file   Social History Narrative   Single  has two boysOccupation:  Self-employed in [employer] Smoker quit in 2001Alcohol use-no Drug use-no   Poor diet - fast food Daily Caffeine Use-coffee  1 cup coffee daily.      Past Surgical History  Procedure Date  . Fracture surgery 04/2008   ORIF left clavicle using Katrinka Blazing and Matthew anatomic clavicle plate. Plate later removed.  . Repair of clavical     Family History  Problem Relation Age of Onset  . Cancer Mother     uterine  . Thyroid disease Mother   . Stroke Father   . Diabetes Other   . Heart disease Other   . Cancer Other     colon cancer    No Known Allergies  Current Outpatient Prescriptions on File Prior to Visit  Medication Sig Dispense Refill  . clindamycin (CLEOCIN) 300 MG capsule Take 1 capsule (300 mg total) by mouth 3 (three) times daily.  30 capsule  0  . dexlansoprazole (DEXILANT) 60 MG capsule Take 60 mg by mouth every morning.        . sulfamethoxazole-trimethoprim (BACTRIM DS) 800-160 MG per tablet Take 1 tablet by mouth 2 (two) times daily.        Marland Kitchen zolpidem (AMBIEN) 5 MG tablet Take 1 tablet (5 mg total) by mouth at bedtime as needed for sleep.  30 tablet  0    BP 112/86  Pulse 78  Temp(Src) 97.8 F (36.6 C) (Oral)  Resp 18  Ht 6' 3.5" (1.918  m)  Wt 253 lb (114.76 kg)  BMI 31.21 kg/m2       Objective:   Physical Exam  Constitutional: He appears well-developed and well-nourished.  HENT:  Head: Normocephalic and atraumatic.  Neck: Normal range of motion.  Cardiovascular: Normal rate and regular rhythm.   Pulmonary/Chest: Effort normal and breath sounds normal.  Skin:       Near resolution of abscess left leg.  Psychiatric: He has a normal mood and affect. His speech is normal and behavior is normal.          Assessment & Plan:  Case was reviewed with Dr. Rodena Medin. No problem-specific assessment & plan notes found for this encounter. bystolic [id number] exp 04/14 #28

## 2011-06-23 NOTE — Patient Instructions (Signed)
Complete your lab work on the first floor. Stop losartan, start bystolic. Follow up in 1 month.

## 2011-06-23 NOTE — Assessment & Plan Note (Signed)
Pt has one more day of abx left.  Pt instructed to complete abx.

## 2011-06-23 NOTE — Assessment & Plan Note (Signed)
Pt thinks symptoms started after he started the ARB.  Will try switching him to a beta blocker.  Will also check some additional studies- EBV panel, HIV, LFT.  F/u in 1 month.

## 2011-06-24 ENCOUNTER — Institutional Professional Consult (permissible substitution): Payer: BC Managed Care – PPO | Admitting: Pulmonary Disease

## 2011-06-24 ENCOUNTER — Telehealth: Payer: Self-pay | Admitting: Family

## 2011-06-24 LAB — EPSTEIN-BARR VIRUS VCA ANTIBODY PANEL: EBV NA IgG: 2.23 {ISR} — ABNORMAL HIGH

## 2011-06-24 NOTE — Telephone Encounter (Signed)
Pls call pt and let him know that his labs look good.  HIV is negative.  Mono testing shows that he has had mono in the past but does not currently have mono.

## 2011-06-25 NOTE — Telephone Encounter (Signed)
Pt notified per Melissa's instruction and voices understanding. Advised pt to f/u as instructed in 1 month or earlier if symptoms worsen. Pt voices understanding.

## 2011-06-30 ENCOUNTER — Telehealth: Payer: Self-pay | Admitting: *Deleted

## 2011-06-30 ENCOUNTER — Other Ambulatory Visit: Payer: Self-pay | Admitting: Family

## 2011-06-30 NOTE — Telephone Encounter (Signed)
Received message from pt that he had an old rx of Amitriptyline that was prescribed to help him sleep. Pt has taken what was left and is requesting a refill to restart medication. Please advise.

## 2011-07-01 MED ORDER — AMITRIPTYLINE HCL 10 MG PO TABS: 10.0000 mg | ORAL_TABLET | Freq: Every evening | ORAL | Status: DC | PRN

## 2011-07-01 NOTE — Telephone Encounter (Signed)
Pt.notified

## 2011-07-01 NOTE — Telephone Encounter (Signed)
Rx sent to pharmacy   

## 2011-07-01 NOTE — Telephone Encounter (Signed)
Request for Amitriptyline has been denied as it was sent in electronically earlier today.

## 2011-08-21 LAB — CBC
Hemoglobin: 14.9
MCHC: 35.1
MCV: 94.7
RBC: 4.49

## 2011-08-25 LAB — WOUND CULTURE

## 2011-08-25 LAB — ANAEROBIC CULTURE: Gram Stain: NONE SEEN

## 2011-08-25 LAB — C-REACTIVE PROTEIN: CRP: 0.6 — ABNORMAL HIGH (ref <0.6)

## 2011-08-25 LAB — CBC
HCT: 42.2
MCHC: 34.2
MCV: 95.9
RBC: 4.4

## 2011-08-25 LAB — DIFFERENTIAL
Eosinophils Absolute: 0.1
Eosinophils Relative: 2
Lymphs Abs: 2.4
Monocytes Absolute: 0.3
Monocytes Relative: 6

## 2011-08-25 LAB — SEDIMENTATION RATE: Sed Rate: 15

## 2011-09-04 ENCOUNTER — Encounter: Payer: Self-pay | Admitting: Gastroenterology

## 2011-09-18 ENCOUNTER — Other Ambulatory Visit: Payer: Self-pay | Admitting: Family

## 2011-09-30 ENCOUNTER — Ambulatory Visit (INDEPENDENT_AMBULATORY_CARE_PROVIDER_SITE_OTHER): Payer: BC Managed Care – PPO | Admitting: Family

## 2011-09-30 ENCOUNTER — Encounter: Payer: Self-pay | Admitting: Family

## 2011-09-30 ENCOUNTER — Telehealth: Payer: Self-pay | Admitting: Family

## 2011-09-30 VITALS — BP 134/98 | HR 76 | Temp 98.2°F | Resp 16 | Ht 75.5 in | Wt 265.0 lb

## 2011-09-30 DIAGNOSIS — IMO0002 Reserved for concepts with insufficient information to code with codable children: Secondary | ICD-10-CM

## 2011-09-30 DIAGNOSIS — L02818 Cutaneous abscess of other sites: Secondary | ICD-10-CM

## 2011-09-30 DIAGNOSIS — R5383 Other fatigue: Secondary | ICD-10-CM

## 2011-09-30 DIAGNOSIS — I10 Essential (primary) hypertension: Secondary | ICD-10-CM

## 2011-09-30 DIAGNOSIS — E291 Testicular hypofunction: Secondary | ICD-10-CM

## 2011-09-30 DIAGNOSIS — R5381 Other malaise: Secondary | ICD-10-CM

## 2011-09-30 LAB — TSH: TSH: 2.373 u[IU]/mL (ref 0.350–4.500)

## 2011-09-30 MED ORDER — DOXYCYCLINE HYCLATE 100 MG PO CAPS: 100.0000 mg | ORAL_CAPSULE | Freq: Two times a day (BID) | ORAL | Status: AC

## 2011-09-30 NOTE — Patient Instructions (Signed)
Please follow up on Friday, sooner if increased pain, redness or fever. Go to ER if severe.

## 2011-09-30 NOTE — Assessment & Plan Note (Signed)
Will plan to treat with doxycycline with close follow up.  No drainable abscess today- feels very hard/non-fluctuant.

## 2011-09-30 NOTE — Assessment & Plan Note (Signed)
Unchanged.  We discussed the importance of sleep hygiene and avoiding caffeine.  (pt has been drinking energy drinks).  OSA is still in the differential. Pt is agreeable to home sleep study. Will also check a TSH due to recent weight gain and testosterone level.

## 2011-09-30 NOTE — Assessment & Plan Note (Signed)
Elevated BP today.  Repeat on Friday.

## 2011-09-30 NOTE — Progress Notes (Signed)
Subjective:    Patient ID: Raymond Mathews, male    DOB: 1966/10/26, 45 y.o.   MRN: [id number]  HPI  Mr.  Mathews is a 45 yr old male who presents today with several concerns.  1. Severe fatigue-  He reports that he has trouble staying asleep.  Wakes up constantly all night. He rarely naps.  Has gained 12 pounds since his last visit.  Does not believe that he snores.  He drinks one cup coffee in the morning, occasional soda in the afternoon.  He does reports some hand ad foot swelling.  Sister recently diagnosed with sleep apnea.  2. HTN- reports that he had a reading 170/110.    3.  Cyst above left ear- painful swelling, redness. Started 3 days ago. Worsening. Afebrile.    Review of Systems See HPI  Past Medical History  Diagnosis Date  . GERD (gastroesophageal reflux disease)   . Anxiety   . Neck pain   . Tobacco abuse     history of  . Clavicle fracture 04/2008    left comminuted clavicle fracture (complicated by wound infection)  . Cervical disc disease   . Varicose veins   . Gastritis   . Chest pain     treadmill 12/17/10--no ischemia and no arrhythmia  . Palpitations   . Pre-syncope     Echo and treadmill 12/17/10-- no further workup. EF 60%.  Diastolic dysfunction  . Diastolic dysfunction 12/17/10    by Echo    History   Social History  . Marital Status: Single    Spouse Name: N/A    Number of Children: 2  . Years of Education: N/A   Occupational History  . OWNER     Holiday representative   Social History Main Topics  . Smoking status: Former Smoker    Quit date: 08/05/2000  . Smokeless tobacco: Never Used  . Alcohol Use: No  . Drug Use: No  . Sexually Active: Not on file   Other Topics Concern  . Not on file   Social History Narrative   Single  has two boysOccupation:  Self-employed in [employer] Smoker quit in 2001Alcohol use-no Drug use-no   Poor diet - fast food Daily Caffeine Use-coffee  1 cup coffee daily.      Past Surgical History    Procedure Date  . Fracture surgery 04/2008    ORIF left clavicle using Katrinka Blazing and Matthew anatomic clavicle plate. Plate later removed.  . Repair of clavical     Family History  Problem Relation Age of Onset  . Cancer Mother     uterine  . Thyroid disease Mother   . Stroke Father   . Diabetes Other   . Heart disease Other   . Cancer Other     colon cancer    No Known Allergies  Current Outpatient Prescriptions on File Prior to Visit  Medication Sig Dispense Refill  . amitriptyline (ELAVIL) 10 MG tablet Take 1 tablet (10 mg total) by mouth at bedtime as needed.  30 tablet  2  . BYSTOLIC 5 MG tablet TAKE 1 TABLET (5 MG TOTAL) BY MOUTH DAILY.  30 tablet  1  . dexlansoprazole (DEXILANT) 60 MG capsule Take 60 mg by mouth every morning.          BP 134/98  Pulse 76  Temp(Src) 98.2 F (36.8 C) (Oral)  Resp 16  Ht 6' 3.5" (1.918 m)  Wt 265 lb (120.203 kg)  BMI 32.69 kg/m2  Objective:   Physical Exam  Constitutional: He appears well-developed and well-nourished.  Musculoskeletal:       No significant edema is noted.   Skin:          + tender induration/erythema noted above left ear. No fluctuance.  Extends down in front of ear.  + tender ? Lymph node noted behind the left ear.           Assessment & Plan:   BP Readings from Last 3 Encounters:  09/30/11 134/98  06/23/11 112/86  06/17/11 120/86

## 2011-10-01 LAB — TESTOSTERONE, FREE, TOTAL, SHBG
Testosterone, Free: 49.8 pg/mL (ref 47.0–244.0)
Testosterone-% Free: 3.5 % — ABNORMAL HIGH (ref 1.6–2.9)

## 2011-10-01 NOTE — Telephone Encounter (Signed)
Pt notified and advised to call back Monday if he hasn't received call re: referral. F/u scheduled for Friday at 9:30am with Melissa for BP recheck.

## 2011-10-01 NOTE — Telephone Encounter (Signed)
Pls call pt and let him know that his testosterone level is low.  This may be contributing to his fatigue.  I would like to refer him to urology for testosterone treatment.

## 2011-10-03 ENCOUNTER — Ambulatory Visit: Payer: BC Managed Care – PPO | Admitting: Family

## 2011-10-06 ENCOUNTER — Other Ambulatory Visit: Payer: Self-pay | Admitting: Family

## 2011-10-06 NOTE — Telephone Encounter (Signed)
OK to send #30 with 2 refills pls.

## 2011-10-06 NOTE — Telephone Encounter (Signed)
Refills last sent for Amitriptyline in August. Please advise quantity of refills?

## 2011-10-07 NOTE — Telephone Encounter (Signed)
Refill sent to pharmacy.   

## 2011-10-10 ENCOUNTER — Ambulatory Visit (INDEPENDENT_AMBULATORY_CARE_PROVIDER_SITE_OTHER): Payer: BC Managed Care – PPO | Admitting: Family

## 2011-10-10 ENCOUNTER — Encounter: Payer: Self-pay | Admitting: Family

## 2011-10-10 VITALS — BP 140/100 | HR 72 | Temp 97.8°F | Resp 16 | Ht 75.5 in | Wt 265.1 lb

## 2011-10-10 DIAGNOSIS — IMO0002 Reserved for concepts with insufficient information to code with codable children: Secondary | ICD-10-CM

## 2011-10-10 DIAGNOSIS — R5383 Other fatigue: Secondary | ICD-10-CM

## 2011-10-10 DIAGNOSIS — R7989 Other specified abnormal findings of blood chemistry: Secondary | ICD-10-CM | POA: Insufficient documentation

## 2011-10-10 DIAGNOSIS — L02818 Cutaneous abscess of other sites: Secondary | ICD-10-CM

## 2011-10-10 DIAGNOSIS — E291 Testicular hypofunction: Secondary | ICD-10-CM

## 2011-10-10 DIAGNOSIS — R5381 Other malaise: Secondary | ICD-10-CM

## 2011-10-10 DIAGNOSIS — I10 Essential (primary) hypertension: Secondary | ICD-10-CM

## 2011-10-10 DIAGNOSIS — F411 Generalized anxiety disorder: Secondary | ICD-10-CM

## 2011-10-10 MED ORDER — SERTRALINE HCL 50 MG PO TABS: 50.0000 mg | ORAL_TABLET | Freq: Every day | ORAL | Status: DC

## 2011-10-10 MED ORDER — AMLODIPINE BESYLATE 5 MG PO TABS: 5.0000 mg | ORAL_TABLET | Freq: Every day | ORAL | Status: DC

## 2011-10-10 MED ORDER — SULFAMETHOXAZOLE-TMP DS 800-160 MG PO TABS: 1.0000 | ORAL_TABLET | Freq: Two times a day (BID) | ORAL | Status: AC

## 2011-10-10 NOTE — Assessment & Plan Note (Signed)
Likely due to hypogonadism, but OSA is also a possibility. He will perform a home sleep study.

## 2011-10-10 NOTE — Progress Notes (Signed)
Subjective:    Patient ID: Raymond Mathews, male    DOB: 05/07/1966, 45 y.o.   MRN: [id number]  HPI  Raymond Mathews is a 45 yr old male who presents today for follow up.  1) HTN- He is concerned about his elevated BP.  2) Near syncope- He had episode today.  He was sitting in his car.  He reported shortness of breath.  Symptoms lasted about 30 minutes. He had associated GERD syptoms.  Neg stress test 12/17/10. Neg 48 hour holter 6/12.  He has had extensive work up.    3) Low testosterone- scheduled to see Urology re: testosterone therapy. Review of Systems See HPI  Past Medical History  Diagnosis Date  . GERD (gastroesophageal reflux disease)   . Anxiety   . Neck pain   . Tobacco abuse     history of  . Clavicle fracture 04/2008    left comminuted clavicle fracture (complicated by wound infection)  . Cervical disc disease   . Varicose veins   . Gastritis   . Chest pain     treadmill 12/17/10--no ischemia and no arrhythmia  . Palpitations   . Pre-syncope     Echo and treadmill 12/17/10-- no further workup. EF 60%.  Diastolic dysfunction  . Diastolic dysfunction 12/17/10    by Echo    History   Social History  . Marital Status: Single    Spouse Name: N/A    Number of Children: 2  . Years of Education: N/A   Occupational History  . OWNER     Holiday representative   Social History Main Topics  . Smoking status: Former Smoker    Quit date: 08/05/2000  . Smokeless tobacco: Never Used  . Alcohol Use: No  . Drug Use: No  . Sexually Active: Not on file   Other Topics Concern  . Not on file   Social History Narrative   Single  has two boysOccupation:  Self-employed in Met[employer] Smoker quit in 2001Alcohol use-no Drug use-no   Poor diet - fast food Daily Caffeine Use-coffee  1 cup coffee daily.      Past Surgical History  Procedure Date  . Fracture surgery 04/2008    ORIF left clavicle using Katrinka Blazing and Matthew anatomic clavicle plate. Plate later removed.  .  Repair of clavical     Family History  Problem Relation Age of Onset  . Cancer Mother     uterine  . Thyroid disease Mother   . Stroke Father   . Diabetes Other   . Heart disease Other   . Cancer Other     colon cancer    No Known Allergies  Current Outpatient Prescriptions on File Prior to Visit  Medication Sig Dispense Refill  . BYSTOLIC 5 MG tablet TAKE 1 TABLET (5 MG TOTAL) BY MOUTH DAILY.  30 tablet  1  . dexlansoprazole (DEXILANT) 60 MG capsule Take 60 mg by mouth every morning.        Marland Kitchen doxycycline (VIBRAMYCIN) 100 MG capsule Take 1 capsule (100 mg total) by mouth 2 (two) times daily.  20 capsule  0    BP 140/100  Pulse 72  Temp(Src) 97.8 F (36.6 C) (Oral)  Resp 16  Ht 6' 3.5" (1.918 m)  Wt 265 lb 1.9 oz (120.258 kg)  BMI 32.70 kg/m2  SpO2 99%       Objective:   Physical Exam  Constitutional: He appears well-developed and well-nourished.  HENT:  Head:  Cardiovascular: Normal rate.   Psychiatric: His speech is normal and behavior is normal. Judgment and thought content normal. Cognition and memory are normal. He does not exhibit a depressed mood.       Mildly anxious appearing male.   Skin: see above- soft induration above left ear without surrounding erythema.        Assessment & Plan:   BP Readings from Last 3 Encounters:  10/10/11 140/100  09/30/11 134/98  06/23/11 112/86

## 2011-10-10 NOTE — Assessment & Plan Note (Signed)
Deteriorated.  Add amlodipine.

## 2011-10-10 NOTE — Assessment & Plan Note (Signed)
Improving.  He still has some induration above his ear.  Today is his last day of doxy.  Will plan to treat for 5 more days with bactrim.  He is to contact us if the swelling is not completely resolved by the time the abx are completed.

## 2011-10-10 NOTE — Assessment & Plan Note (Addendum)
Scheduled to meet with urology to discuss Testosterone replacement therapy.

## 2011-10-10 NOTE — Assessment & Plan Note (Addendum)
I suspect that his near syncope is actually panic attacks.  Will give him a trial of Sertraline. We discussed side effects including risk of weight gain and rare suicidal ideation.  He verbalizes understanding.

## 2011-10-10 NOTE — Patient Instructions (Addendum)
Sertraline 1/2 tablet once daily for 1 week, then increase to a full tablet on week two.   Call if the swelling below your ear dose not completely resolve by the time you complete the new antibiotics.  Follow up in 1 month.

## 2011-11-16 ENCOUNTER — Other Ambulatory Visit: Payer: Self-pay | Admitting: Family

## 2011-11-17 NOTE — Telephone Encounter (Signed)
Rx refill sent to pharmacy. 

## 2011-11-22 ENCOUNTER — Other Ambulatory Visit: Payer: Self-pay | Admitting: Family

## 2011-11-24 NOTE — Telephone Encounter (Signed)
Refill sent to pharmacy for bystolic #30 x no refills. Pt was due for follow up in December and is past due. Please call pt to arrange follow up.

## 2011-11-26 NOTE — Telephone Encounter (Signed)
Informed patient that we did send in a 30 day supply of bystolic to pharmacy. Patient states that he will call back to make follow up appt.

## 2011-11-28 ENCOUNTER — Ambulatory Visit (INDEPENDENT_AMBULATORY_CARE_PROVIDER_SITE_OTHER): Payer: BC Managed Care – PPO | Admitting: Family

## 2011-11-28 ENCOUNTER — Encounter: Payer: Self-pay | Admitting: Family

## 2011-11-28 VITALS — BP 110/86 | HR 66 | Temp 98.1°F | Resp 16 | Wt 261.1 lb

## 2011-11-28 DIAGNOSIS — I10 Essential (primary) hypertension: Secondary | ICD-10-CM

## 2011-11-28 DIAGNOSIS — R7989 Other specified abnormal findings of blood chemistry: Secondary | ICD-10-CM

## 2011-11-28 DIAGNOSIS — L02229 Furuncle of trunk, unspecified: Secondary | ICD-10-CM

## 2011-11-28 DIAGNOSIS — E291 Testicular hypofunction: Secondary | ICD-10-CM

## 2011-11-28 MED ORDER — NEBIVOLOL HCL 5 MG PO TABS: 5.0000 mg | ORAL_TABLET | Freq: Every day | ORAL | Status: DC

## 2011-11-28 MED ORDER — DOXYCYCLINE HYCLATE 100 MG PO TABS: 100.0000 mg | ORAL_TABLET | Freq: Two times a day (BID) | ORAL | Status: AC

## 2011-11-28 NOTE — Patient Instructions (Signed)
Please follow up in 3 months.  

## 2011-11-28 NOTE — Progress Notes (Signed)
Subjective:    Patient ID: Raymond Mathews, male    DOB: 12-14-1965, 46 y.o.   MRN: [id number]  HPI  Raymond Mathews is a 46 yr old male who presents today for follow up.  1) HTN- reports that he is tolerating bystolic. He denies shortness of breath or chest pain.  2) Skin lesion- left anterior chest- started about 1 month ago, had associated streaking beneath. Notes that it is improved, but that he "keeps picking at it and it won't heal."  3) Low testosterone- saw urology and was placed on testosterone supplement. Reports feeling better with improved energy.  Review of Systems See HPI  Past Medical History  Diagnosis Date  . GERD (gastroesophageal reflux disease)   . Anxiety   . Neck pain   . Tobacco abuse     history of  . Clavicle fracture 04/2008    left comminuted clavicle fracture (complicated by wound infection)  . Cervical disc disease   . Varicose veins   . Gastritis   . Chest pain     treadmill 12/17/10--no ischemia and no arrhythmia  . Palpitations   . Pre-syncope     Echo and treadmill 12/17/10-- no further workup. EF 60%.  Diastolic dysfunction  . Diastolic dysfunction 12/17/10    by Echo    History   Social History  . Marital Status: Single    Spouse Name: N/A    Number of Children: 2  . Years of Education: N/A   Occupational History  . OWNER     Holiday representative   Social History Main Topics  . Smoking status: Former Smoker    Quit date: 08/05/2000  . Smokeless tobacco: Never Used  . Alcohol Use: No  . Drug Use: No  . Sexually Active: Not on file   Other Topics Concern  . Not on file   Social History Narrative   Single  has two boysOccupation:  Self-employed in [employer] Smoker quit in 2001Alcohol use-no Drug use-no   Poor diet - fast food Daily Caffeine Use-coffee  1 cup coffee daily.      Past Surgical History  Procedure Date  . Fracture surgery 04/2008    ORIF left clavicle using Katrinka Blazing and Matthew anatomic clavicle plate.  Plate later removed.  . Repair of clavical     Family History  Problem Relation Age of Onset  . Cancer Mother     uterine  . Thyroid disease Mother   . Stroke Father   . Diabetes Other   . Heart disease Other   . Cancer Other     colon cancer    No Known Allergies  Current Outpatient Prescriptions on File Prior to Visit  Medication Sig Dispense Refill  . amLODipine (NORVASC) 5 MG tablet Take 1 tablet (5 mg total) by mouth daily.  30 tablet  2  . dexlansoprazole (DEXILANT) 60 MG capsule Take 60 mg by mouth every morning.          BP 110/86  Pulse 66  Temp(Src) 98.1 F (36.7 C) (Oral)  Resp 16  Wt 261 lb 1.9 oz (118.443 kg)       Objective:   Physical Exam  Constitutional: He appears well-developed and well-nourished. No distress.  Cardiovascular: Normal rate and regular rhythm.   No murmur heard. Pulmonary/Chest: Effort normal and breath sounds normal. No respiratory distress. He has no wheezes. He has no rales. He exhibits no tenderness.  Musculoskeletal: He exhibits no edema.  Skin:  1 inch wide firm area of induration/dry skin on top noted on left anterior chest. Lesion is pink in color, no surrounding erythema is noted.           Assessment & Plan:

## 2011-11-29 LAB — BASIC METABOLIC PANEL
BUN: 20 mg/dL (ref 6–23)
CO2: 29 mEq/L (ref 19–32)
Chloride: 102 mEq/L (ref 96–112)
Glucose, Bld: 91 mg/dL (ref 70–99)
Potassium: 5.1 mEq/L (ref 3.5–5.3)

## 2011-11-30 ENCOUNTER — Encounter: Payer: Self-pay | Admitting: Family

## 2011-11-30 DIAGNOSIS — L02229 Furuncle of trunk, unspecified: Secondary | ICD-10-CM | POA: Insufficient documentation

## 2011-11-30 NOTE — Assessment & Plan Note (Signed)
Appears to be partially healed. Advised pt not to "pick" at the lesion. Will plan to treat with doxy given his hx of recurrent mrsa infection.

## 2011-11-30 NOTE — Assessment & Plan Note (Signed)
Now on testosterone replacement therapy per urology.  Clinically improved. Defer management to urology.

## 2011-11-30 NOTE — Assessment & Plan Note (Signed)
Clinically stable on bystolic and amlodipine, continue same.

## 2011-12-02 ENCOUNTER — Ambulatory Visit (INDEPENDENT_AMBULATORY_CARE_PROVIDER_SITE_OTHER): Payer: BC Managed Care – PPO | Admitting: Family

## 2011-12-02 ENCOUNTER — Telehealth: Payer: Self-pay | Admitting: Family

## 2011-12-02 ENCOUNTER — Encounter: Payer: Self-pay | Admitting: Family

## 2011-12-02 ENCOUNTER — Ambulatory Visit (HOSPITAL_BASED_OUTPATIENT_CLINIC_OR_DEPARTMENT_OTHER)
Admission: RE | Admit: 2011-12-02 | Discharge: 2011-12-02 | Disposition: A | Discharge status: Home / Self Care | Payer: BC Managed Care – PPO | Source: Ambulatory Visit | Attending: Family | Admitting: Family

## 2011-12-02 VITALS — BP 140/90 | HR 78 | Temp 97.8°F | Resp 16 | Wt 265.0 lb

## 2011-12-02 DIAGNOSIS — X58XXXA Exposure to other specified factors, initial encounter: Secondary | ICD-10-CM | POA: Insufficient documentation

## 2011-12-02 DIAGNOSIS — M79609 Pain in unspecified limb: Secondary | ICD-10-CM

## 2011-12-02 DIAGNOSIS — L02229 Furuncle of trunk, unspecified: Secondary | ICD-10-CM

## 2011-12-02 DIAGNOSIS — S6990XA Unspecified injury of unspecified wrist, hand and finger(s), initial encounter: Secondary | ICD-10-CM | POA: Insufficient documentation

## 2011-12-02 DIAGNOSIS — IMO0002 Reserved for concepts with insufficient information to code with codable children: Secondary | ICD-10-CM

## 2011-12-02 NOTE — Patient Instructions (Signed)
Please complete your x-ray.  We will contact you with your results.

## 2011-12-02 NOTE — Telephone Encounter (Signed)
Reviewed x-ray with pt. Neg for fracture.

## 2011-12-02 NOTE — Assessment & Plan Note (Addendum)
New. X-ray performed today is negative for fracture.  Recommended ice prn swelling and motrin prn pain.

## 2011-12-02 NOTE — Progress Notes (Signed)
Subjective:    Patient ID: Raymond Mathews, male    DOB: 1966-05-08, 46 y.o.   MRN: [id number]  HPI  Mr.  Mathews is a 46 yr old male who presents today with complaint of right hand injury. Injury occurred approximately 90 minutes a go.  He reports that he was trying to mount a industrial kitchen hood which weighed 1000lbs. He had hoisted the hood with a jack.  The jack broke and started spinning, he tried to grab the jack and his right hand was struck.  Pain is worst at the base of the thumb.  Chest boil- pt reports that this is improving, though he has not started the doxycycline. Review of Systems See HPI  Past Medical History  Diagnosis Date  . GERD (gastroesophageal reflux disease)   . Anxiety   . Neck pain   . Tobacco abuse     history of  . Clavicle fracture 04/2008    left comminuted clavicle fracture (complicated by wound infection)  . Cervical disc disease   . Varicose veins   . Gastritis   . Chest pain     treadmill 12/17/10--no ischemia and no arrhythmia  . Palpitations   . Pre-syncope     Echo and treadmill 12/17/10-- no further workup. EF 60%.  Diastolic dysfunction  . Diastolic dysfunction 12/17/10    by Echo    History   Social History  . Marital Status: Single    Spouse Name: N/A    Number of Children: 2  . Years of Education: N/A   Occupational History  . OWNER     Holiday representative   Social History Main Topics  . Smoking status: Former Smoker    Quit date: 08/05/2000  . Smokeless tobacco: Never Used  . Alcohol Use: No  . Drug Use: No  . Sexually Active: Not on file   Other Topics Concern  . Not on file   Social History Narrative   Single  has two boysOccupation:  Self-employed in [employer] Smoker quit in 2001Alcohol use-no Drug use-no   Poor diet - fast food Daily Caffeine Use-coffee  1 cup coffee daily.      Past Surgical History  Procedure Date  . Fracture surgery 04/2008    ORIF left clavicle using Katrinka Blazing and Matthew anatomic  clavicle plate. Plate later removed.  . Repair of clavical     Family History  Problem Relation Age of Onset  . Cancer Mother     uterine  . Thyroid disease Mother   . Stroke Father   . Diabetes Other   . Heart disease Other   . Cancer Other     colon cancer    No Known Allergies  Current Outpatient Prescriptions on File Prior to Visit  Medication Sig Dispense Refill  . amLODipine (NORVASC) 5 MG tablet Take 1 tablet (5 mg total) by mouth daily.  30 tablet  2  . dexlansoprazole (DEXILANT) 60 MG capsule Take 60 mg by mouth every morning.        Marland Kitchen doxycycline (VIBRA-TABS) 100 MG tablet Take 1 tablet (100 mg total) by mouth 2 (two) times daily.  14 tablet  0  . nebivolol (BYSTOLIC) 5 MG tablet Take 1 tablet (5 mg total) by mouth daily.  30 tablet  5  . sertraline (ZOLOFT) 50 MG tablet          BP 140/90  Pulse 78  Temp(Src) 97.8 F (36.6 C) (Oral)  Resp 16  Wt 265 lb (  120.203 kg)       Objective:   Physical Exam  Constitutional: He appears well-developed and well-nourished. No distress.  Cardiovascular: Normal rate and regular rhythm.   No murmur heard. Pulmonary/Chest: Effort normal and breath sounds normal. No respiratory distress. He has no wheezes. He has no rales. He exhibits no tenderness.  Musculoskeletal:       Slight bruising noted to fingertips/nailbeds of fingers on right hand.  Mild swelling of right hand.  Full ROM of R fingers,  Increased pain with movement of right thumb.   Skin:       Boil left upper anterior chest is smaller and less red in color today.  Psychiatric: He has a normal mood and affect. His behavior is normal. Judgment and thought content normal.          Assessment & Plan:

## 2011-12-02 NOTE — Assessment & Plan Note (Signed)
Improved, he is instructed to start doxy.

## 2012-01-07 ENCOUNTER — Other Ambulatory Visit: Payer: Self-pay | Admitting: Family

## 2012-01-15 ENCOUNTER — Other Ambulatory Visit: Payer: Self-pay | Admitting: Gastroenterology

## 2012-01-19 ENCOUNTER — Other Ambulatory Visit: Payer: Self-pay | Admitting: *Deleted

## 2012-01-19 MED ORDER — DEXLANSOPRAZOLE 60 MG PO CPDR: 60.0000 mg | DELAYED_RELEASE_CAPSULE | ORAL | Status: DC

## 2012-01-19 MED ORDER — SERTRALINE HCL 50 MG PO TABS: 50.0000 mg | ORAL_TABLET | Freq: Every day | ORAL | Status: DC

## 2012-01-19 NOTE — Telephone Encounter (Signed)
Pt left message requesting refill of Dexilant and zoloft. Refills sent to pharmacy #30 x 2 refills each. Pt notified.

## 2012-04-13 ENCOUNTER — Other Ambulatory Visit: Payer: Self-pay | Admitting: Family

## 2012-04-13 NOTE — Telephone Encounter (Signed)
Refill sent to pharmacy for amlodipine #30 x no refills. Pt was due for f/u in April. Please call pt to arrange appt.

## 2012-04-13 NOTE — Telephone Encounter (Signed)
Informed patient that a 30 day supply w/no refills has been sent to the pharmacy and that patient does need to make a follow up appointment.

## 2012-04-15 ENCOUNTER — Other Ambulatory Visit: Payer: Self-pay | Admitting: Family

## 2012-05-13 ENCOUNTER — Telehealth: Payer: Self-pay | Admitting: Family

## 2012-05-13 MED ORDER — AMLODIPINE BESYLATE 5 MG PO TABS: 5.0000 mg | ORAL_TABLET | Freq: Every day | ORAL | Status: DC

## 2012-05-13 NOTE — Telephone Encounter (Signed)
Left detailed message stating that a 2 week supply has been sent to pharmacy and that if patient is not seen within 2 weeks then no further refills can be given.

## 2012-05-13 NOTE — Telephone Encounter (Signed)
Patient would like refill of amlodipine sent to [facility] in [facility]

## 2012-05-13 NOTE — Telephone Encounter (Signed)
Amlodipine 2 week supply has been sent to pharmacy as pt has been notified that he needs appt for further refills. If pt is not seen within 2 weeks no further refills will be given. Please call pt to arrange appt.

## 2012-05-20 ENCOUNTER — Telehealth: Payer: Self-pay | Admitting: Family

## 2012-05-20 MED ORDER — DEXLANSOPRAZOLE 60 MG PO CPDR: 60.0000 mg | DELAYED_RELEASE_CAPSULE | Freq: Every day | ORAL | Status: DC

## 2012-05-20 NOTE — Telephone Encounter (Signed)
Patient would like a refill of dexilant

## 2012-05-20 NOTE — Telephone Encounter (Signed)
Refill sent to pharmacy.   

## 2012-05-24 ENCOUNTER — Ambulatory Visit (INDEPENDENT_AMBULATORY_CARE_PROVIDER_SITE_OTHER): Payer: BC Managed Care – PPO | Admitting: Family

## 2012-05-24 ENCOUNTER — Telehealth: Payer: Self-pay | Admitting: Family

## 2012-05-24 ENCOUNTER — Encounter: Payer: Self-pay | Admitting: Family

## 2012-05-24 VITALS — BP 120/88 | HR 69 | Temp 97.8°F | Resp 16 | Ht 75.5 in | Wt 270.0 lb

## 2012-05-24 DIAGNOSIS — K219 Gastro-esophageal reflux disease without esophagitis: Secondary | ICD-10-CM

## 2012-05-24 DIAGNOSIS — E291 Testicular hypofunction: Secondary | ICD-10-CM

## 2012-05-24 DIAGNOSIS — R0789 Other chest pain: Secondary | ICD-10-CM

## 2012-05-24 DIAGNOSIS — I10 Essential (primary) hypertension: Secondary | ICD-10-CM

## 2012-05-24 DIAGNOSIS — F411 Generalized anxiety disorder: Secondary | ICD-10-CM

## 2012-05-24 DIAGNOSIS — R7989 Other specified abnormal findings of blood chemistry: Secondary | ICD-10-CM

## 2012-05-24 DIAGNOSIS — R079 Chest pain, unspecified: Secondary | ICD-10-CM

## 2012-05-24 MED ORDER — LISINOPRIL 10 MG PO TABS: 10.0000 mg | ORAL_TABLET | Freq: Every day | ORAL | Status: DC

## 2012-05-24 MED ORDER — NEBIVOLOL HCL 5 MG PO TABS: 5.0000 mg | ORAL_TABLET | Freq: Every day | ORAL | Status: DC

## 2012-05-24 MED ORDER — OMEPRAZOLE 40 MG PO CPDR: 40.0000 mg | DELAYED_RELEASE_CAPSULE | Freq: Every day | ORAL | Status: DC

## 2012-05-24 NOTE — Assessment & Plan Note (Signed)
Clinically improving. Has follow up schedule with urology.

## 2012-05-24 NOTE — Patient Instructions (Addendum)
Stop dexilant, start omeprazole.  Stop amlodipine, start lisinopril. Go to ER if you develop worsening chest pain.   Follow up in 1 month.

## 2012-05-24 NOTE — Telephone Encounter (Signed)
Refill- bystolic 5mg  tablet. Take one tablet (5mg  tablet) by mouth daily. Qty 30 last fill 11.28.12

## 2012-05-24 NOTE — Assessment & Plan Note (Signed)
Due to complaint of swelling, will d/c amlodipine and start lisinopril.  Plan follow up in 1 month with bmet.  Continue bystolic.

## 2012-05-24 NOTE — Telephone Encounter (Signed)
#  30 x no refills sent to pharmacy.

## 2012-05-24 NOTE — Assessment & Plan Note (Signed)
Can't afford dexilant. Will switch to omeprazole.

## 2012-05-24 NOTE — Assessment & Plan Note (Signed)
Unchanged.  He is not sure he wants to go back on medication.  He is finally feeling better in terms of energy.  I question if his fatigue was related to low T or if it was side effect of sertraline?  Hold off on resuming meds for now- monitor.

## 2012-05-24 NOTE — Assessment & Plan Note (Signed)
EKG is normal today- reviewed.  Suspect pain on chest wall is due to scar tissue at site of former boil.  L arm pain likely related to hx of cervical disc disease.

## 2012-05-24 NOTE — Progress Notes (Signed)
Subjective:    Patient ID: Raymond Mathews, male    DOB: 1966-02-11, 46 y.o.   MRN: [id number]  HPI  Raymond Mathews is a 46 yr old male who presents today for follow up.    Chest pain- Reports that he sometimes gets tenderness overlying the left anterior chest at the location of previous boil.  Pain sometimes radiates down the left arm and is associated with tingling.    HTN- currently on amlodipine and bystolic.  He reports that he continues to have swelling or hands and feet. Reports that his BP 160/112 in the middle of the night.  HR would be about 110. He reports + swelling in hands   Anxiety- reports that he stopped sertraline as it didn't help him sleep.  Thought that it was to be used for sleep.  Not sure if he noted any improvement in his anxiety.   Reports that he gets anxious about once a day.    Low Testosterone- started testosterone therapy.  Energy is improved.   GERD- dexilant is too expensive.   Review of Systems    see HPI  Past Medical History  Diagnosis Date  . GERD (gastroesophageal reflux disease)   . Anxiety   . Neck pain   . Tobacco abuse     history of  . Clavicle fracture 04/2008    left comminuted clavicle fracture (complicated by wound infection)  . Cervical disc disease   . Varicose veins   . Gastritis   . Chest pain     treadmill 12/17/10--no ischemia and no arrhythmia  . Palpitations   . Pre-syncope     Echo and treadmill 12/17/10-- no further workup. EF 60%.  Diastolic dysfunction  . Diastolic dysfunction 12/17/10    by Echo    History   Social History  . Marital Status: Single    Spouse Name: N/A    Number of Children: 2  . Years of Education: N/A   Occupational History  . OWNER     Holiday representative   Social History Main Topics  . Smoking status: Former Smoker    Quit date: 08/05/2000  . Smokeless tobacco: Never Used  . Alcohol Use: No  . Drug Use: No  . Sexually Active: Not on file   Other Topics Concern  . Not on file   Social  History Narrative   Single  has two boysOccupation:  Self-employed in [employer] Smoker quit in 2001Alcohol use-no Drug use-no   Poor diet - fast food Daily Caffeine Use-coffee  1 cup coffee daily.      Past Surgical History  Procedure Date  . Fracture surgery 04/2008    ORIF left clavicle using Katrinka Blazing and Matthew anatomic clavicle plate. Plate later removed.  . Repair of clavical     Family History  Problem Relation Age of Onset  . Cancer Mother     uterine  . Thyroid disease Mother   . Stroke Father   . Diabetes Other   . Heart disease Other   . Cancer Other     colon cancer    No Known Allergies  Current Outpatient Prescriptions on File Prior to Visit  Medication Sig Dispense Refill  . dexlansoprazole (DEXILANT) 60 MG capsule Take 1 capsule (60 mg total) by mouth daily.  30 capsule  0  . nebivolol (BYSTOLIC) 5 MG tablet Take 1 tablet (5 mg total) by mouth daily.  30 tablet  5  . lisinopril (PRINIVIL,ZESTRIL) 10 MG tablet Take 1  tablet (10 mg total) by mouth daily.  30 tablet  0  . omeprazole (PRILOSEC) 40 MG capsule Take 1 capsule (40 mg total) by mouth daily.  30 capsule  5  . sertraline (ZOLOFT) 50 MG tablet Take 1 tablet (50 mg total) by mouth daily.  30 tablet  2    BP 120/88  Pulse 69  Temp 97.8 F (36.6 C) (Oral)  Resp 16  Ht 6' 3.5" (1.918 m)  Wt 270 lb (122.471 kg)  BMI 33.30 kg/m2  SpO2 96%    Objective:   Physical Exam  Constitutional: He appears well-developed and well-nourished. No distress.  Cardiovascular: Normal rate and regular rhythm.   No murmur heard. Pulmonary/Chest: Effort normal and breath sounds normal. No respiratory distress. He has no wheezes. He has no rales. He exhibits no tenderness.  Musculoskeletal: He exhibits no edema.       + left anterior chest wall tenderness to palpation.    Neurological:       Bilateral UE strength is 5/5.  Psychiatric: He has a normal mood and affect. His behavior is normal. Judgment and  thought content normal.          Assessment & Plan:

## 2012-06-21 ENCOUNTER — Telehealth: Payer: Self-pay | Admitting: Family

## 2012-06-21 MED ORDER — LISINOPRIL 10 MG PO TABS: 10.0000 mg | ORAL_TABLET | Freq: Every day | ORAL | Status: DC

## 2012-06-21 MED ORDER — NEBIVOLOL HCL 5 MG PO TABS: 5.0000 mg | ORAL_TABLET | Freq: Every day | ORAL | Status: DC

## 2012-06-21 NOTE — Telephone Encounter (Signed)
Nebivolol HCl (Tab) BYSTOLIC 5 MG Take 1 tablet (5 mg total) by mouth daily

## 2012-07-12 ENCOUNTER — Encounter: Payer: Self-pay | Admitting: Family

## 2012-07-12 ENCOUNTER — Ambulatory Visit (INDEPENDENT_AMBULATORY_CARE_PROVIDER_SITE_OTHER): Payer: BC Managed Care – PPO | Admitting: Family

## 2012-07-12 VITALS — BP 156/96 | HR 75 | Temp 97.8°F | Resp 16 | Wt 272.0 lb

## 2012-07-12 DIAGNOSIS — R7989 Other specified abnormal findings of blood chemistry: Secondary | ICD-10-CM

## 2012-07-12 DIAGNOSIS — G5603 Carpal tunnel syndrome, bilateral upper limbs: Secondary | ICD-10-CM | POA: Insufficient documentation

## 2012-07-12 DIAGNOSIS — I10 Essential (primary) hypertension: Secondary | ICD-10-CM

## 2012-07-12 DIAGNOSIS — E291 Testicular hypofunction: Secondary | ICD-10-CM

## 2012-07-12 DIAGNOSIS — G5600 Carpal tunnel syndrome, unspecified upper limb: Secondary | ICD-10-CM

## 2012-07-12 DIAGNOSIS — R404 Transient alteration of awareness: Secondary | ICD-10-CM

## 2012-07-12 DIAGNOSIS — F17200 Nicotine dependence, unspecified, uncomplicated: Secondary | ICD-10-CM

## 2012-07-12 DIAGNOSIS — R400 Somnolence: Secondary | ICD-10-CM

## 2012-07-12 MED ORDER — LISINOPRIL 20 MG PO TABS: 20.0000 mg | ORAL_TABLET | Freq: Every day | ORAL | Status: DC

## 2012-07-12 NOTE — Assessment & Plan Note (Signed)
Pt reports that he quit.

## 2012-07-12 NOTE — Assessment & Plan Note (Signed)
Deteriorated.  Increase lisinopril from 10 mg to 20mg . Continue bystolic 5mg , follow up in 2-3 weeks for OV and bmet.

## 2012-07-12 NOTE — Patient Instructions (Addendum)
Please follow up in 2-3 weeks.

## 2012-07-12 NOTE — Assessment & Plan Note (Signed)
Recommended cock up wrist splints while sleeping and as able during the day.  He does not wish to pursue further evaluation of this at this time.

## 2012-07-12 NOTE — Progress Notes (Signed)
Subjective:    Patient ID: Raymond Mathews, male    DOB: [birth year], 46 y.o.   MRN: [id number]  HPI  Raymond Mathews is a 46 yr old male who presents today for follow up.     1) HTN- reports that last night he had some right sided neck pain and arm pain.  Took his BP-160/116. He continues bystolic and lisinopril.  2)   Low testosterone- he had testosterone implanted in his buttock per Urology.  Apparently, topical therapy was not effective in raising his levels.    3) Insomnia/daytime somnolence- reports that he sleeps very poorly and he often wakes himself up while sleeping.  Not sure if he snores.  Feels drowsy in the afternoons.  Sister has OSA.  4) Weight gain- he reports that he is active in his job and stopped soda.  Discouraged by weight gain.   5) hand numbness/tingling-  Pain radiates up both forearms.  Hands often "fall asleep" at night while pt is sleeping.  Review of Systems See HPI  Past Medical History  Diagnosis Date  . GERD (gastroesophageal reflux disease)   . Anxiety   . Neck pain   . Tobacco abuse     history of  . Clavicle fracture 04/2008    left comminuted clavicle fracture (complicated by wound infection)  . Cervical disc disease   . Varicose veins   . Gastritis   . Chest pain     treadmill 12/17/10--no ischemia and no arrhythmia  . Palpitations   . Pre-syncope     Echo and treadmill 12/17/10-- no further workup. EF 60%.  Diastolic dysfunction  . Diastolic dysfunction 12/17/10    by Echo    History   Social History  . Marital Status: Single    Spouse Name: N/A    Number of Children: 2  . Years of Education: N/A   Occupational History  . OWNER     Holiday representative   Social History Main Topics  . Smoking status: Former Smoker    Quit date: 08/05/2000  . Smokeless tobacco: Never Used  . Alcohol Use: No  . Drug Use: No  . Sexually Active: Not on file   Other Topics Concern  . Not on file   Social History Narrative   Single  has two  boysOccupation:  Self-employed in [employer] Smoker quit in 2001Alcohol use-no Drug use-no   Poor diet - fast food Daily Caffeine Use-coffee  1 cup coffee daily.      Past Surgical History  Procedure Date  . Fracture surgery 04/2008    ORIF left clavicle using Katrinka Blazing and Matthew anatomic clavicle plate. Plate later removed.  . Repair of clavical     Family History  Problem Relation Age of Onset  . Cancer Mother     uterine  . Thyroid disease Mother   . Stroke Father   . Diabetes Other   . Heart disease Other   . Cancer Other     colon cancer    No Known Allergies  Current Outpatient Prescriptions on File Prior to Visit  Medication Sig Dispense Refill  . nebivolol (BYSTOLIC) 5 MG tablet Take 1 tablet (5 mg total) by mouth daily.  30 tablet  0  . omeprazole (PRILOSEC) 40 MG capsule Take 1 capsule (40 mg total) by mouth daily.  30 capsule  5  . DISCONTD: lisinopril (PRINIVIL,ZESTRIL) 10 MG tablet Take 1 tablet (10 mg total) by mouth daily.  30 tablet  0  BP 156/96  Pulse 75  Temp 97.8 F (36.6 C) (Oral)  Resp 16  Wt 272 lb (123.378 kg)  SpO2 99%       Objective:   Physical Exam  Constitutional: He appears well-developed and well-nourished. No distress.  Cardiovascular: Normal rate and regular rhythm.   No murmur heard. Skin: Skin is warm and dry.  Psychiatric: He has a normal mood and affect. His behavior is normal. Judgment and thought content normal.  Neuro: strong equal hand grasps. Bilateral UE strength is 5/5.  + phalans bilaterally, + tinnel bilaterally.      Assessment & Plan:

## 2012-07-12 NOTE — Assessment & Plan Note (Signed)
Clinically improving. Management per Urology.

## 2012-07-21 ENCOUNTER — Telehealth: Payer: Self-pay | Admitting: *Deleted

## 2012-07-21 NOTE — Telephone Encounter (Signed)
Received fax from [facility] requesting order for home sleep study. Order completed/signed and faxed to [phone] along with insurance info.

## 2012-07-22 ENCOUNTER — Telehealth: Payer: Self-pay | Admitting: *Deleted

## 2012-07-22 NOTE — Telephone Encounter (Signed)
Received message from [facility] at [facility] stating dx was missing from order faxed to them yesterday.  She is requesting that we call them with a verbal dx.  Please advise.

## 2012-07-23 NOTE — Telephone Encounter (Signed)
780.09 (somnolence)

## 2012-07-23 NOTE — Telephone Encounter (Signed)
Provided [place] with code below.

## 2012-07-29 ENCOUNTER — Telehealth: Payer: Self-pay | Admitting: Family

## 2012-07-29 MED ORDER — NEBIVOLOL HCL 5 MG PO TABS: 5.0000 mg | ORAL_TABLET | Freq: Every day | ORAL | Status: DC

## 2012-07-29 NOTE — Telephone Encounter (Signed)
Spoke to patient and informed him that a 14 day supply of bystolic has been sent to the pharmacy and he states that he will have to call back to schedule follow up with Melissa.

## 2012-07-29 NOTE — Telephone Encounter (Signed)
Pt is due for follow up of BP and needs labs. 14 day supply of bystolic sent to pharmacy until he can arrange appt. Please call pt to schedule f/u.

## 2012-07-29 NOTE — Telephone Encounter (Signed)
Refill-bystolic 5mg  tablet. Take one tablet (5mg  total) by mouth daily. Qty 30 last fill 7.29.13

## 2012-08-04 ENCOUNTER — Encounter: Payer: Self-pay | Admitting: Family

## 2012-08-04 ENCOUNTER — Ambulatory Visit (INDEPENDENT_AMBULATORY_CARE_PROVIDER_SITE_OTHER): Payer: BC Managed Care – PPO | Admitting: Family

## 2012-08-04 VITALS — BP 132/94 | HR 65 | Temp 98.4°F | Resp 16 | Ht 75.5 in

## 2012-08-04 DIAGNOSIS — I10 Essential (primary) hypertension: Secondary | ICD-10-CM

## 2012-08-04 DIAGNOSIS — R0789 Other chest pain: Secondary | ICD-10-CM

## 2012-08-04 DIAGNOSIS — G5603 Carpal tunnel syndrome, bilateral upper limbs: Secondary | ICD-10-CM

## 2012-08-04 DIAGNOSIS — R079 Chest pain, unspecified: Secondary | ICD-10-CM

## 2012-08-04 DIAGNOSIS — G5600 Carpal tunnel syndrome, unspecified upper limb: Secondary | ICD-10-CM

## 2012-08-04 LAB — BASIC METABOLIC PANEL
BUN: 16 mg/dL (ref 6–23)
Calcium: 9 mg/dL (ref 8.4–10.5)
Glucose, Bld: 95 mg/dL (ref 70–99)
Potassium: 4.8 mEq/L (ref 3.5–5.3)

## 2012-08-04 LAB — LIPID PANEL
HDL: 37 mg/dL — ABNORMAL LOW (ref >39)
Total CHOL/HDL Ratio: 4.1 Ratio
VLDL: 14 mg/dL (ref 0–40)

## 2012-08-04 NOTE — Progress Notes (Signed)
Subjective:    Patient ID: Raymond Mathews, male    DOB: 1966/10/14, 46 y.o.   MRN: [id number]  HPI  Mr.  Mathews is a 46 yr old male who presents today for follow up.   HTN-  Last visit pt's lisinopril was increased from 10mg  to 20mg .  He continues bystolic.  Reports reading 140/106.  Pt reports that this occurred while he was stressed.  When BP was up he reports that he also had  Headache, neck pain and some chest discomfort which he attributed to "something I ate." Denies current chest pain.    BP Readings from Last 3 Encounters:  08/04/12 132/94  07/12/12 156/96  05/24/12 120/88    CTS-  Reports symptoms are somewhat improved, but he never did purchase the wrist splints.    Review of Systems See HPI  Past Medical History  Diagnosis Date  . GERD (gastroesophageal reflux disease)   . Anxiety   . Neck pain   . Tobacco abuse     history of  . Clavicle fracture 04/2008    left comminuted clavicle fracture (complicated by wound infection)  . Cervical disc disease   . Varicose veins   . Gastritis   . Chest pain     treadmill 12/17/10--no ischemia and no arrhythmia  . Palpitations   . Pre-syncope     Echo and treadmill 12/17/10-- no further workup. EF 60%.  Diastolic dysfunction  . Diastolic dysfunction 12/17/10    by Echo    History   Social History  . Marital Status: Single    Spouse Name: N/A    Number of Children: 2  . Years of Education: N/A   Occupational History  . OWNER     Holiday representative   Social History Main Topics  . Smoking status: Former Smoker    Quit date: 08/05/2000  . Smokeless tobacco: Never Used  . Alcohol Use: No  . Drug Use: No  . Sexually Active: Not on file   Other Topics Concern  . Not on file   Social History Narrative   Single  has two boysOccupation:  Self-employed in [employer] Smoker quit in 2001Alcohol use-no Drug use-no   Poor diet - fast food Daily Caffeine Use-coffee  1 cup coffee daily.      Past Surgical  History  Procedure Date  . Fracture surgery 04/2008    ORIF left clavicle using Katrinka Blazing and Matthew anatomic clavicle plate. Plate later removed.  . Repair of clavical     Family History  Problem Relation Age of Onset  . Cancer Mother     uterine  . Thyroid disease Mother   . Stroke Father   . Diabetes Other   . Heart disease Other   . Cancer Other     colon cancer    No Known Allergies  Current Outpatient Prescriptions on File Prior to Visit  Medication Sig Dispense Refill  . lisinopril (PRINIVIL,ZESTRIL) 20 MG tablet Take 1 tablet (20 mg total) by mouth daily.  30 tablet  1  . nebivolol (BYSTOLIC) 5 MG tablet Take 1 tablet (5 mg total) by mouth daily.  14 tablet  0  . omeprazole (PRILOSEC) 40 MG capsule Take 1 capsule (40 mg total) by mouth daily.  30 capsule  5    BP 132/94  Pulse 65  Temp 98.4 F (36.9 C) (Oral)  Resp 16  Ht 6' 3.5" (1.918 m)  SpO2 97%       Objective:  Physical Exam  Constitutional: He appears well-developed and well-nourished. No distress.  Neck: Neck supple.  Cardiovascular: Normal rate and regular rhythm.   No murmur heard. Pulses:      Carotid pulses are 2+ on the right side, and 2+ on the left side.      No carotid bruits noted.  Pulmonary/Chest: Effort normal and breath sounds normal. No respiratory distress. He has no wheezes. He has no rales. He exhibits no tenderness.  Lymphadenopathy:    He has no cervical adenopathy.          Assessment & Plan:

## 2012-08-04 NOTE — Assessment & Plan Note (Signed)
Pt has had extensive work up in the past.  He has been evaluated by cardiology (Dr. Myrtis Ser) and has had a neg exercise stress test 1/12, CTA chest neg 12/09/10, 2-D echo 01/11/11 LVEF 55-60%/grade II diastolic dysfunction.  Has known gerd.  Neg holter 05/02/11.  EKG normal today.  Obtain flp as pt is fasting.

## 2012-08-04 NOTE — Assessment & Plan Note (Signed)
Improved, monitor

## 2012-08-04 NOTE — Assessment & Plan Note (Addendum)
Improving, continue current dose of lisinopril and bystolic.  He will call us in 1 week with readings.  Obtain follow up BMET.  EKG performed today is normal.

## 2012-08-04 NOTE — Patient Instructions (Addendum)
Please complete blood work prior to leaving.  Go to the ER if you develop recurrent chest pain. Check blood pressure once daily for 1 week and call us with your readings.  Follow up in 3 months.

## 2012-08-06 ENCOUNTER — Encounter: Payer: Self-pay | Admitting: Family

## 2012-08-11 ENCOUNTER — Encounter (HOSPITAL_BASED_OUTPATIENT_CLINIC_OR_DEPARTMENT_OTHER): Payer: Self-pay

## 2012-08-11 ENCOUNTER — Emergency Department (HOSPITAL_BASED_OUTPATIENT_CLINIC_OR_DEPARTMENT_OTHER): Payer: BC Managed Care – PPO

## 2012-08-11 ENCOUNTER — Emergency Department (HOSPITAL_BASED_OUTPATIENT_CLINIC_OR_DEPARTMENT_OTHER)
Admission: EM | Admit: 2012-08-11 | Discharge: 2012-08-12 | Disposition: A | Discharge status: Home / Self Care | Payer: BC Managed Care – PPO | Attending: Emergency Medicine | Admitting: Emergency Medicine

## 2012-08-11 DIAGNOSIS — Z79899 Other long term (current) drug therapy: Secondary | ICD-10-CM | POA: Insufficient documentation

## 2012-08-11 DIAGNOSIS — R112 Nausea with vomiting, unspecified: Secondary | ICD-10-CM | POA: Insufficient documentation

## 2012-08-11 DIAGNOSIS — R51 Headache: Secondary | ICD-10-CM | POA: Insufficient documentation

## 2012-08-11 MED ORDER — HYDROMORPHONE HCL PF 1 MG/ML IJ SOLN
1.0000 mg | Freq: Once | INTRAMUSCULAR | Status: AC
  Administered 2012-08-11: 1 mg via INTRAVENOUS
  Filled 2012-08-11: qty 1

## 2012-08-11 MED ORDER — METOCLOPRAMIDE HCL 5 MG/ML IJ SOLN
10.0000 mg | Freq: Once | INTRAMUSCULAR | Status: AC
  Administered 2012-08-11: 10 mg via INTRAVENOUS
  Filled 2012-08-11: qty 2

## 2012-08-11 NOTE — ED Notes (Signed)
Pt c/o headache x 2 days with vomiting x 2 episodes today. Denies any diarrhea. Pt c/o being tired from inability to sleep due to headache. Pt denies hx of migraines.

## 2012-08-11 NOTE — ED Provider Notes (Signed)
History     CSN: [id number]  Arrival date & time 08/11/12  2044   First MD Initiated Contact with Patient 08/11/12 2216      Chief Complaint  Patient presents with  . Headache    (Consider location/radiation/quality/duration/timing/severity/associated sxs/prior treatment) HPI Complaint of headache occipital in location radiating to the top of his head dull in nature moderate to severe onset 2 days ago treated with Aleve without relief. Patient vomited 4 times today. No fever no head trauma no other complaint no other associated symptoms. Nothing makes symptoms better or worse Past Medical History  Diagnosis Date  . GERD (gastroesophageal reflux disease)   . Anxiety   . Neck pain   . Tobacco abuse     history of  . Clavicle fracture 04/2008    left comminuted clavicle fracture (complicated by wound infection)  . Cervical disc disease   . Varicose veins   . Gastritis   . Chest pain     treadmill 12/17/10--no ischemia and no arrhythmia  . Palpitations   . Pre-syncope     Echo and treadmill 12/17/10-- no further workup. EF 60%.  Diastolic dysfunction  . Diastolic dysfunction 12/17/10    by Echo    Past Surgical History  Procedure Date  . Fracture surgery 04/2008    ORIF left clavicle using Katrinka Blazing and Matthew anatomic clavicle plate. Plate later removed.  . Repair of clavical     Family History  Problem Relation Age of Onset  . Cancer Mother     uterine  . Thyroid disease Mother   . Stroke Father   . Diabetes Other   . Heart disease Other   . Cancer Other     colon cancer    History  Substance Use Topics  . Smoking status: Former Smoker    Quit date: 08/05/2000  . Smokeless tobacco: Never Used  . Alcohol Use: No      Review of Systems  Constitutional: Negative.   Respiratory: Negative.   Cardiovascular: Negative.   Gastrointestinal: Positive for nausea and vomiting.  Musculoskeletal: Negative.   Skin: Negative.   Neurological: Positive for headaches.    Hematological: Negative.   Psychiatric/Behavioral: Negative.   All other systems reviewed and are negative.    Allergies  Review of patient's allergies indicates no known allergies.  Home Medications   Current Outpatient Rx  Name Route Sig Dispense Refill  . LISINOPRIL 20 MG PO TABS Oral Take 1 tablet (20 mg total) by mouth daily. 30 tablet 1  . NEBIVOLOL HCL 5 MG PO TABS Oral Take 1 tablet (5 mg total) by mouth daily. 14 tablet 0    PT DUE FOR OFFICE VISIT, BP CHECK/LABS  . OMEPRAZOLE 40 MG PO CPDR Oral Take 1 capsule (40 mg total) by mouth daily. 30 capsule 5    BP 137/94  Pulse 58  Temp 97.8 F (36.6 C) (Oral)  Resp 18  Ht 6\' 3"  (1.905 m)  Wt 260 lb (117.935 kg)  BMI 32.50 kg/m2  SpO2 98%  Physical Exam  Nursing note and vitals reviewed. Constitutional: He is oriented to person, place, and time. He appears well-developed and well-nourished.  HENT:  Head: Normocephalic and atraumatic.  Eyes: Conjunctivae normal are normal. Pupils are equal, round, and reactive to light.       Fundi benign  Neck: Neck supple. No tracheal deviation present. No thyromegaly present.  Cardiovascular: Normal rate and regular rhythm.   No murmur heard. Pulmonary/Chest: Effort normal and breath  sounds normal.  Abdominal: Soft. Bowel sounds are normal. He exhibits no distension. There is no tenderness.  Musculoskeletal: Normal range of motion. He exhibits no edema and no tenderness.  Neurological: He is alert and oriented to person, place, and time. He has normal reflexes. Coordination normal.       Gait normal Romberg normal pronator drift normal  Skin: Skin is warm and dry. No rash noted.  Psychiatric: He has a normal mood and affect.    ED Course  Procedures (including critical care time)  Labs Reviewed - No data to display No results found. Results for orders placed in visit on 08/04/12  BASIC METABOLIC PANEL      Component Value Range   Sodium 142  135 - 145 mEq/L   Potassium  4.8  3.5 - 5.3 mEq/L   Chloride 108  96 - 112 mEq/L   CO2 27  19 - 32 mEq/L   Glucose, Bld 95  70 - 99 mg/dL   BUN 16  6 - 23 mg/dL   Creat 1.61  0.96 - 0.45 mg/dL   Calcium 9.0  8.4 - 40.9 mg/dL  LIPID PANEL      Component Value Range   Cholesterol 150  0 - 200 mg/dL   Triglycerides 69  <811 mg/dL   HDL 37 (*) >91 mg/dL   Total CHOL/HDL Ratio 4.1     VLDL 14  0 - 40 mg/dL   LDL Cholesterol 99  0 - 99 mg/dL   Ct Head Wo Contrast  08/11/2012  *RADIOLOGY REPORT*  Clinical Data: Headache.  CT HEAD WITHOUT CONTRAST  Technique:  Contiguous axial images were obtained from the base of the skull through the vertex without contrast.  Comparison: Head CT scan 10/05/2008 and brain MRI 12/07/2005.  Findings: The brain appears normal evidence of infarct, hemorrhage, mass lesion, mass effect, midline shift or abnormal extra-axial fluid collection.  There is no hydrocephalus or pneumocephalus. The calvarium is intact.  IMPRESSION: Negative exam.   Original Report Authenticated By: Bernadene Bell. Maricela Curet, M.D.      No diagnosis found.  11:35 PM pain is improved the nausea has resolved after treatment with intravenous Reglan. However he requests more pain medicine. States pain is now a 5 on a scale of 1-10. when he arrived it was 9/10. 12:20 AM patient is alert and motor Glasgow Coma Score 15 pain is improved however he is somewhat nauseated after treatment with intravenous Dilaudid. Zofran by mouth ordered  MDM  Plan followup with PMD or return if symptoms worsen Diagnosis nonspecific headache        Doug Sou, MD 08/12/12 0030

## 2012-08-11 NOTE — ED Notes (Signed)
HA, n/v x 2 days

## 2012-08-11 NOTE — ED Notes (Signed)
Pt states that he has a history of HTN and is currently taking meds for same.

## 2012-08-11 NOTE — ED Notes (Signed)
Pt states no improvement in HA. MD aware.

## 2012-08-12 MED ORDER — ONDANSETRON HCL 8 MG PO TABS
8.0000 mg | ORAL_TABLET | Freq: Once | ORAL | Status: AC
  Administered 2012-08-12: 8 mg via ORAL
  Filled 2012-08-12: qty 1

## 2012-08-12 NOTE — ED Notes (Signed)
Pt c/o nausea. MD aware.

## 2012-08-12 NOTE — ED Notes (Signed)
MD at bedside. 

## 2012-08-16 ENCOUNTER — Telehealth: Payer: Self-pay | Admitting: Family

## 2012-08-16 MED ORDER — NEBIVOLOL HCL 10 MG PO TABS: 10.0000 mg | ORAL_TABLET | Freq: Every day | ORAL | Status: DC

## 2012-08-16 NOTE — Telephone Encounter (Signed)
Refill-bystolic 5mg  tab. Take one tablet by mouth every day. Qty 14 last fill 9.5.13

## 2012-08-16 NOTE — Telephone Encounter (Signed)
Hold for additional phone note re: BP readings.

## 2012-08-16 NOTE — Telephone Encounter (Signed)
Please ask pt to increase bystolic to 10 mg (2 tabs) we will plan to repeat his bp at his follow up visit.

## 2012-08-16 NOTE — Telephone Encounter (Signed)
Patient states that he was supposed to call in to give [place] his BP readings  9.15.13 144/96 9.16.17 132/92 9.17.13 136/90 9.18.13 142/93 9.19.13 151/107 pt states that he went to ED downstairs for migraines on this day 9.20.13 146/98  Patient did schedule a post hospital follow up for 08/24/12

## 2012-08-16 NOTE — Telephone Encounter (Signed)
Notified pt. Currently needs refill of Bystolic. He requests 10mg  strength so he will not have to take 2 tablets at a time. Rx sent to pharmacy for 10mg  strength once a day.

## 2012-08-17 NOTE — Telephone Encounter (Signed)
See additional phone note re: dose change and refill.

## 2012-08-20 ENCOUNTER — Encounter: Payer: Self-pay | Admitting: Gastroenterology

## 2012-08-24 ENCOUNTER — Ambulatory Visit (INDEPENDENT_AMBULATORY_CARE_PROVIDER_SITE_OTHER): Payer: BC Managed Care – PPO | Admitting: Family

## 2012-08-24 ENCOUNTER — Encounter: Payer: Self-pay | Admitting: Family

## 2012-08-24 VITALS — BP 134/94 | HR 65 | Temp 97.8°F | Resp 16 | Wt 265.1 lb

## 2012-08-24 DIAGNOSIS — G43909 Migraine, unspecified, not intractable, without status migrainosus: Secondary | ICD-10-CM | POA: Insufficient documentation

## 2012-08-24 DIAGNOSIS — R400 Somnolence: Secondary | ICD-10-CM

## 2012-08-24 DIAGNOSIS — R404 Transient alteration of awareness: Secondary | ICD-10-CM

## 2012-08-24 DIAGNOSIS — I10 Essential (primary) hypertension: Secondary | ICD-10-CM

## 2012-08-24 NOTE — Assessment & Plan Note (Signed)
BP Readings from Last 3 Encounters:  08/24/12 134/94  08/11/12 144/87  08/04/12 132/94  Fair bp control.  Continue lisinopril and bystolic.

## 2012-08-24 NOTE — Assessment & Plan Note (Signed)
Resolved.  Now with milder tension type headaches.  I have recommended that he follow through with his sleep study as osa could be contributing to his headaches. He wishes to set this up through [facility] instead of home health.

## 2012-08-24 NOTE — Patient Instructions (Addendum)
You will be contact about your referral.  Please let us know if you have not heard back within 1 week about your referral. Please schedule a follow up appointment in 3 months.  

## 2012-08-24 NOTE — Progress Notes (Signed)
Subjective:    Patient ID: Raymond Mathews, male    DOB: Aug 05, 1966, 46 y.o.   MRN: [id number]  HPI  Mr.  Mathews is a 46 yr old male who presents today for follow up.  He reports that he was seen in the ED on 9/18 with chief complaint of HA.  ED records are reviewed.  He reported HA was severe that day and was associated with vomiting and photophobia.  BP was also elevated that day.  HA on 9/18 was 10/10.  Since returning home he reports some more mild headaches which he rates 3/10.  He is not taking anything for these more mild headaches. He attributes these headaches to neck discomfort.  Somnolence- he reports that he and the home health company played phone tag and he was not successful in arranging a home sleep study.   Review of Systems See HPI  Past Medical History  Diagnosis Date  . GERD (gastroesophageal reflux disease)   . Anxiety   . Neck pain   . Tobacco abuse     history of  . Clavicle fracture 04/2008    left comminuted clavicle fracture (complicated by wound infection)  . Cervical disc disease   . Varicose veins   . Gastritis   . Chest pain     treadmill 12/17/10--no ischemia and no arrhythmia  . Palpitations   . Pre-syncope     Echo and treadmill 12/17/10-- no further workup. EF 60%.  Diastolic dysfunction  . Diastolic dysfunction 12/17/10    by Echo    History   Social History  . Marital Status: Single    Spouse Name: N/A    Number of Children: 2  . Years of Education: N/A   Occupational History  . OWNER     Holiday representative   Social History Main Topics  . Smoking status: Former Smoker    Quit date: 08/05/2000  . Smokeless tobacco: Never Used  . Alcohol Use: No  . Drug Use: No  . Sexually Active: Not on file   Other Topics Concern  . Not on file   Social History Narrative   Single  has two boysOccupation:  Self-employed in Metall[employer] Smoker quit in 2001Alcohol use-no Drug use-no   Poor diet - fast food Daily Caffeine Use-coffee  1 cup  coffee daily.      Past Surgical History  Procedure Date  . Fracture surgery 04/2008    ORIF left clavicle using Katrinka Blazing and Matthew anatomic clavicle plate. Plate later removed.  . Repair of clavical     Family History  Problem Relation Age of Onset  . Cancer Mother     uterine  . Thyroid disease Mother   . Stroke Father   . Diabetes Other   . Heart disease Other   . Cancer Other     colon cancer    No Known Allergies  Current Outpatient Prescriptions on File Prior to Visit  Medication Sig Dispense Refill  . lisinopril (PRINIVIL,ZESTRIL) 20 MG tablet Take 1 tablet (20 mg total) by mouth daily.  30 tablet  1  . nebivolol (BYSTOLIC) 10 MG tablet Take 1 tablet (10 mg total) by mouth daily.  30 tablet  0  . omeprazole (PRILOSEC) 40 MG capsule Take 1 capsule (40 mg total) by mouth daily.  30 capsule  5    BP 134/94  Pulse 65  Temp 97.8 F (36.6 C) (Oral)  Resp 16  Wt 265 lb 1.3 oz (120.239 kg)  SpO2  97%       Objective:   Physical Exam  Constitutional: He is oriented to person, place, and time. He appears well-developed and well-nourished. No distress.  Cardiovascular: Normal rate and regular rhythm.   No murmur heard. Pulmonary/Chest: Effort normal and breath sounds normal. No respiratory distress. He has no wheezes. He has no rales. He exhibits no tenderness.  Neurological: He is alert and oriented to person, place, and time.  Psychiatric: He has a normal mood and affect. His behavior is normal. Judgment and thought content normal.          Assessment & Plan:

## 2012-09-15 ENCOUNTER — Other Ambulatory Visit: Payer: Self-pay | Admitting: Family

## 2012-09-15 NOTE — Telephone Encounter (Signed)
Medication refill request.

## 2012-09-16 ENCOUNTER — Telehealth: Payer: Self-pay | Admitting: Family

## 2012-09-16 ENCOUNTER — Other Ambulatory Visit: Payer: Self-pay | Admitting: Family

## 2012-09-16 MED ORDER — LISINOPRIL 20 MG PO TABS: 20.0000 mg | ORAL_TABLET | Freq: Every day | ORAL | Status: DC

## 2012-09-16 NOTE — Telephone Encounter (Signed)
Spoke with pharmacist. No problem with filling rx.

## 2012-09-16 NOTE — Telephone Encounter (Signed)
Refill- lisinopril 20mg  tablet. Take one tablet(20mg  total) by mouth daily. Qty 30 last fill 9.21.13

## 2012-10-26 ENCOUNTER — Ambulatory Visit (INDEPENDENT_AMBULATORY_CARE_PROVIDER_SITE_OTHER): Payer: BC Managed Care – PPO | Admitting: Family

## 2012-10-26 ENCOUNTER — Encounter: Payer: Self-pay | Admitting: Family

## 2012-10-26 VITALS — BP 144/100 | HR 67 | Temp 98.4°F | Resp 16 | Ht 75.5 in | Wt 266.1 lb

## 2012-10-26 DIAGNOSIS — I10 Essential (primary) hypertension: Secondary | ICD-10-CM

## 2012-10-26 DIAGNOSIS — N529 Male erectile dysfunction, unspecified: Secondary | ICD-10-CM

## 2012-10-26 MED ORDER — OLMESARTAN-AMLODIPINE-HCTZ 20-5-12.5 MG PO TABS: 1.0000 | ORAL_TABLET | Freq: Every day | ORAL | Status: DC

## 2012-10-26 MED ORDER — VARDENAFIL HCL 20 MG PO TABS: ORAL_TABLET | ORAL | Status: DC

## 2012-10-26 NOTE — Patient Instructions (Signed)
Please follow up in 2 weeks

## 2012-10-26 NOTE — Progress Notes (Signed)
Subjective:    Patient ID: Raymond Mathews, male    DOB: 11-15-1966, 46 y.o.   MRN: [id number]  HPI  Raymond Mathews is a 46 yr old male who presents today for follow up of his HTN.  He reports that since starting bystolic has has had issues with ED.  He also notes feeling somewhat light headed. Took BP med last night at 8pm.   Review of Systems    see HPI  Past Medical History  Diagnosis Date  . GERD (gastroesophageal reflux disease)   . Anxiety   . Neck pain   . Tobacco abuse     history of  . Clavicle fracture 04/2008    left comminuted clavicle fracture (complicated by wound infection)  . Cervical disc disease   . Varicose veins   . Gastritis   . Chest pain     treadmill 12/17/10--no ischemia and no arrhythmia  . Palpitations   . Pre-syncope     Echo and treadmill 12/17/10-- no further workup. EF 60%.  Diastolic dysfunction  . Diastolic dysfunction 12/17/10    by Echo    History   Social History  . Marital Status: Single    Spouse Name: N/A    Number of Children: 2  . Years of Education: N/A   Occupational History  . OWNER     Holiday representative   Social History Main Topics  . Smoking status: Former Smoker    Quit date: 08/05/2000  . Smokeless tobacco: Never Used  . Alcohol Use: No  . Drug Use: No  . Sexually Active: Not on file   Other Topics Concern  . Not on file   Social History Narrative   Single  has two boysOccupation:  Self-employed in Met[employer] Smoker quit in 2001Alcohol use-no Drug use-no   Poor diet - fast food Daily Caffeine Use-coffee  1 cup coffee daily.      Past Surgical History  Procedure Date  . Fracture surgery 04/2008    ORIF left clavicle using Katrinka Blazing and Matthew anatomic clavicle plate. Plate later removed.  . Repair of clavical     Family History  Problem Relation Age of Onset  . Cancer Mother     uterine  . Thyroid disease Mother   . Stroke Father   . Diabetes Other   . Heart disease Other   . Cancer Other    colon cancer    No Known Allergies  Current Outpatient Prescriptions on File Prior to Visit  Medication Sig Dispense Refill  . omeprazole (PRILOSEC) 40 MG capsule Take 1 capsule (40 mg total) by mouth daily.  30 capsule  5  . Olmesartan-Amlodipine-HCTZ (TRIBENZOR) 20-5-12.5 MG TABS Take 1 tablet by mouth daily.  14 tablet  0  . vardenafil (LEVITRA) 20 MG tablet Take 1/2 tablet by mouth 1 hour prior to intercourse  6 tablet  0    BP 144/100  Pulse 67  Temp 98.4 F (36.9 C) (Oral)  Resp 16  Ht 6' 3.5" (1.918 m)  Wt 266 lb 1.9 oz (120.711 kg)  BMI 32.82 kg/m2  SpO2 96%   Objective:   Physical Exam  Constitutional: He appears well-developed and well-nourished. No distress.  Cardiovascular: Normal rate and regular rhythm.   No murmur heard. Pulmonary/Chest: Effort normal and breath sounds normal. No respiratory distress. He has no wheezes. He has no rales. He exhibits no tenderness.  Musculoskeletal: He exhibits no edema.  Psychiatric: He has a normal mood and affect. His behavior  is normal. Judgment and thought content normal.          Assessment & Plan:

## 2012-10-27 DIAGNOSIS — N529 Male erectile dysfunction, unspecified: Secondary | ICD-10-CM | POA: Insufficient documentation

## 2012-10-27 NOTE — Assessment & Plan Note (Signed)
New. Trial of cialis.  He continues with Urology for testosterone implant therapy.  Hopefully, he will not need long term after we discontinue the bystolic.

## 2012-10-27 NOTE — Assessment & Plan Note (Addendum)
Deteriorated.  Not tolerating bystolic due to ED.  Will D/C Bystolic and lisinopril and change to tribenzor.

## 2012-11-08 ENCOUNTER — Telehealth: Payer: Self-pay | Admitting: Family

## 2012-11-08 MED ORDER — OLMESARTAN-AMLODIPINE-HCTZ 20-5-12.5 MG PO TABS: 1.0000 | ORAL_TABLET | Freq: Every day | ORAL | Status: DC

## 2012-11-08 NOTE — Telephone Encounter (Signed)
Left detailed message on cell voicmail that I have sent a 1 week supply to pt's pharmacy until he can arrange appt to be seen and have blood work taken that day.

## 2012-11-08 NOTE — Telephone Encounter (Signed)
Patient states that he is out of his new bp medication. He would like a refill sent to [facility]. He also states that he will come in to do labs this week or next.

## 2012-11-12 ENCOUNTER — Encounter: Payer: Self-pay | Admitting: Family

## 2012-11-12 ENCOUNTER — Ambulatory Visit (INDEPENDENT_AMBULATORY_CARE_PROVIDER_SITE_OTHER): Payer: BC Managed Care – PPO | Admitting: Family

## 2012-11-12 VITALS — BP 130/90 | HR 72 | Temp 98.0°F | Resp 16 | Ht 75.5 in | Wt 267.0 lb

## 2012-11-12 DIAGNOSIS — N529 Male erectile dysfunction, unspecified: Secondary | ICD-10-CM

## 2012-11-12 DIAGNOSIS — I10 Essential (primary) hypertension: Secondary | ICD-10-CM

## 2012-11-12 LAB — BASIC METABOLIC PANEL
Chloride: 102 mEq/L (ref 96–112)
Potassium: 4.5 mEq/L (ref 3.5–5.3)
Sodium: 139 mEq/L (ref 135–145)

## 2012-11-12 MED ORDER — OLMESARTAN-AMLODIPINE-HCTZ 20-5-12.5 MG PO TABS: 1.0000 | ORAL_TABLET | Freq: Every day | ORAL | Status: DC

## 2012-11-12 NOTE — Assessment & Plan Note (Addendum)
BP Readings from Last 3 Encounters:  11/12/12 130/90  10/26/12 144/100  08/24/12 134/94    Improved, continue tribenzor.   Obtain bmet.

## 2012-11-12 NOTE — Progress Notes (Signed)
Subjective:    Patient ID: Raymond Mathews, male    DOB: December 25, 1965, 46 y.o.   MRN: [id number]  HPI  HTN-  Pt presents today for follow up. Last visit Bystolic was discontinued and he was placed on Tribenzor.  Denies swelling or sob.   ED- symptoms improved since being off of bystolic.     Review of Systems See HPI  Past Medical History  Diagnosis Date  . GERD (gastroesophageal reflux disease)   . Anxiety   . Neck pain   . Tobacco abuse     history of  . Clavicle fracture 04/2008    left comminuted clavicle fracture (complicated by wound infection)  . Cervical disc disease   . Varicose veins   . Gastritis   . Chest pain     treadmill 12/17/10--no ischemia and no arrhythmia  . Palpitations   . Pre-syncope     Echo and treadmill 12/17/10-- no further workup. EF 60%.  Diastolic dysfunction  . Diastolic dysfunction 12/17/10    by Echo    History   Social History  . Marital Status: Single    Spouse Name: N/A    Number of Children: 2  . Years of Education: N/A   Occupational History  . OWNER     Holiday representative   Social History Main Topics  . Smoking status: Former Smoker    Quit date: 08/05/2000  . Smokeless tobacco: Never Used  . Alcohol Use: No  . Drug Use: No  . Sexually Active: Not on file   Other Topics Concern  . Not on file   Social History Narrative   Single  has two boysOccupation:  Self-employed in Metall[employer]ist Smoker quit in 2001Alcohol use-no Drug use-no   Poor diet - fast food Daily Caffeine Use-coffee  1 cup coffee daily.      Past Surgical History  Procedure Date  . Fracture surgery 04/2008    ORIF left clavicle using Katrinka Blazing and Matthew anatomic clavicle plate. Plate later removed.  . Repair of clavical     Family History  Problem Relation Age of Onset  . Cancer Mother     uterine  . Thyroid disease Mother   . Stroke Father   . Diabetes Other   . Heart disease Other   . Cancer Other     colon cancer    No Known  Allergies  Current Outpatient Prescriptions on File Prior to Visit  Medication Sig Dispense Refill  . Olmesartan-Amlodipine-HCTZ (TRIBENZOR) 20-5-12.5 MG TABS Take 1 tablet by mouth daily.  30 tablet  2  . omeprazole (PRILOSEC) 40 MG capsule Take 1 capsule (40 mg total) by mouth daily.  30 capsule  5  . Testosterone 75 MG PLLT by Implant route. Dosed per urology      . vardenafil (LEVITRA) 20 MG tablet Take 1/2 tablet by mouth 1 hour prior to intercourse  6 tablet  0    BP 130/90  Pulse 72  Temp 98 F (36.7 C) (Oral)  Resp 16  Ht 6' 3.5" (1.918 m)  Wt 267 lb 0.6 oz (121.129 kg)  BMI 32.94 kg/m2  SpO2 97%       Objective:   Physical Exam  Constitutional: He appears well-developed and well-nourished. No distress.  Cardiovascular: Normal rate and regular rhythm.   No murmur heard. Pulmonary/Chest: Effort normal and breath sounds normal.  Musculoskeletal: He exhibits no edema.  Skin: Skin is warm and dry.  Psychiatric: He has a normal mood and affect.  His behavior is normal. Judgment and thought content normal.          Assessment & Plan:

## 2012-11-12 NOTE — Patient Instructions (Addendum)
Please complete your blood work prior to leaving. Please schedule a follow up appointment in 3 months.  

## 2012-11-13 NOTE — Assessment & Plan Note (Signed)
Suspect was med related due to bystolic.  Improved.

## 2012-12-21 IMAGING — CT CT HEAD W/O CM
1 of 2 series · 16 of 30 positions shown, 20 images · non-contrast
Comparison: Head CT scan 10/05/2008 and brain MRI 12/07/2005.

CLINICAL DATA: Headache.

CT HEAD WITHOUT CONTRAST
TECHNIQUE: Contiguous axial images were obtained from the base of
the skull through the vertex without contrast.

[Series 2: head 4.8 h37s · axial · 0.45mm/px · z∈[+1226,+1359]mm · 16 of 32 slices shown, 20 images]
[im 2/32  brain]
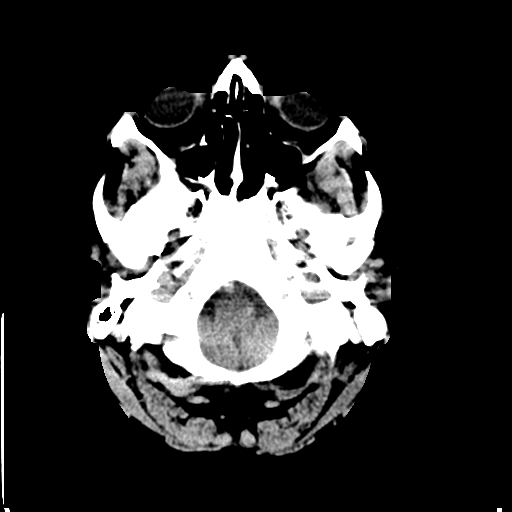
[im 2/32  bone]
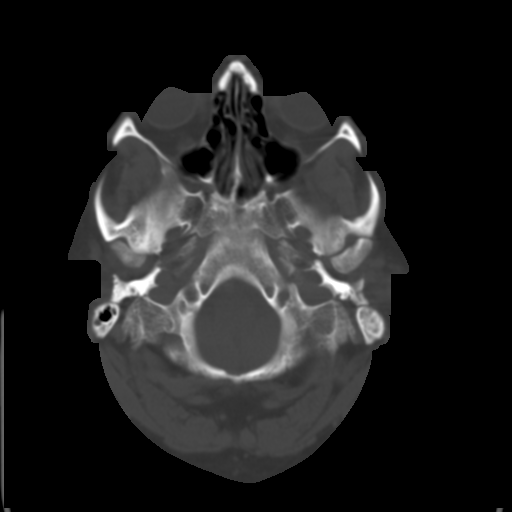
[im 5/32  brain]
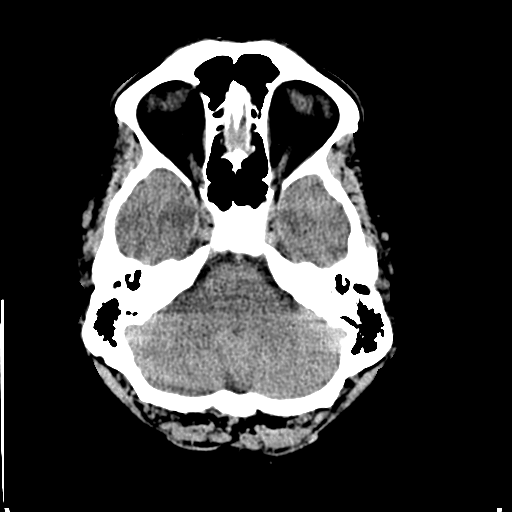
[im 6/32  brain]
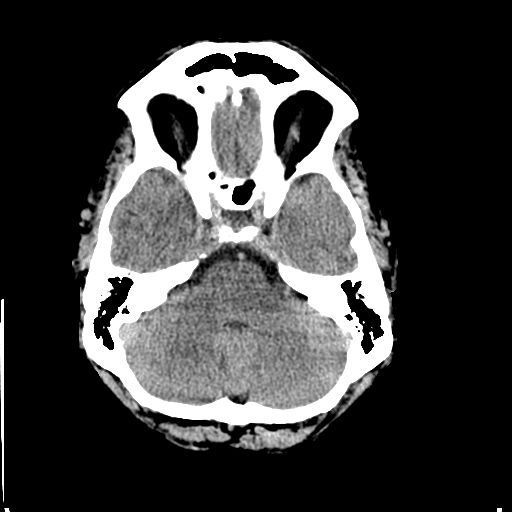
[im 7/32  brain]
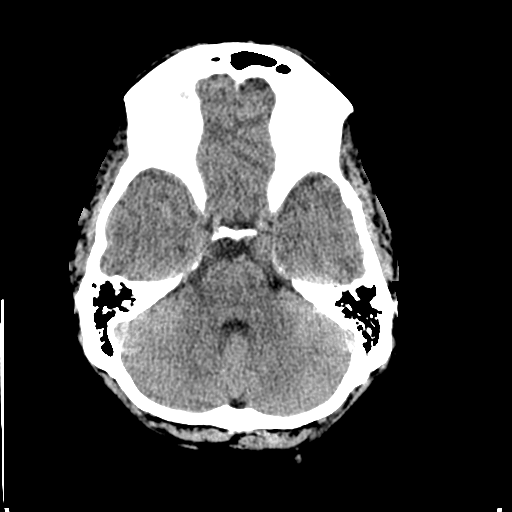
[im 10/32  brain]
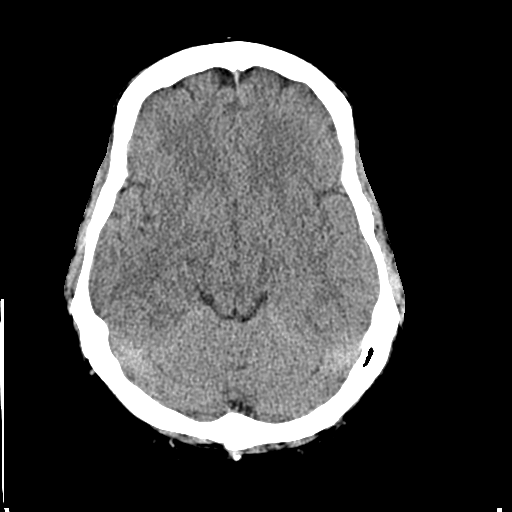
[im 10/32  bone]
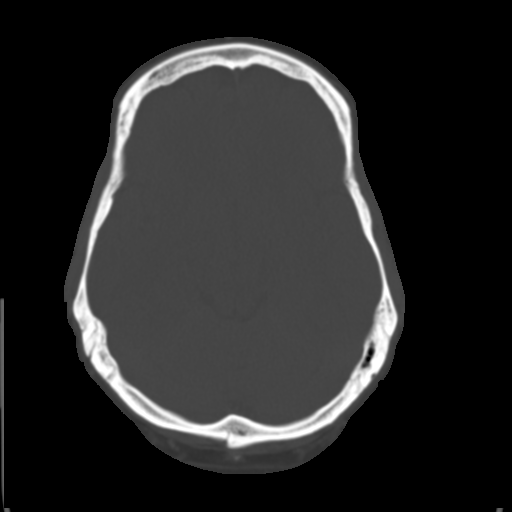
[im 11/32  brain]
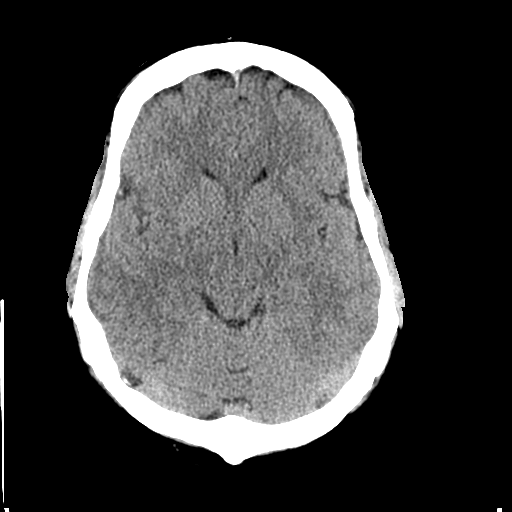
[im 13/32  brain]
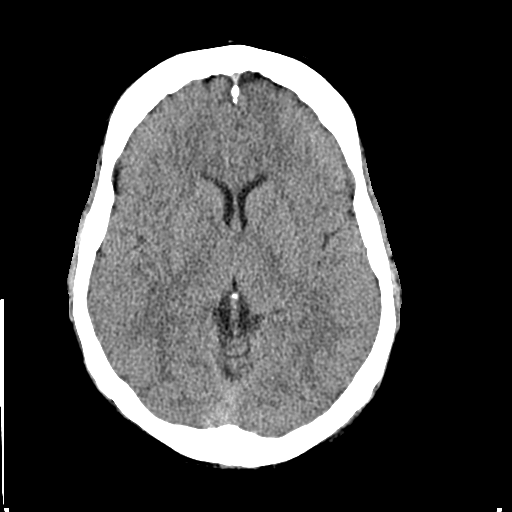
[im 15/32  brain]
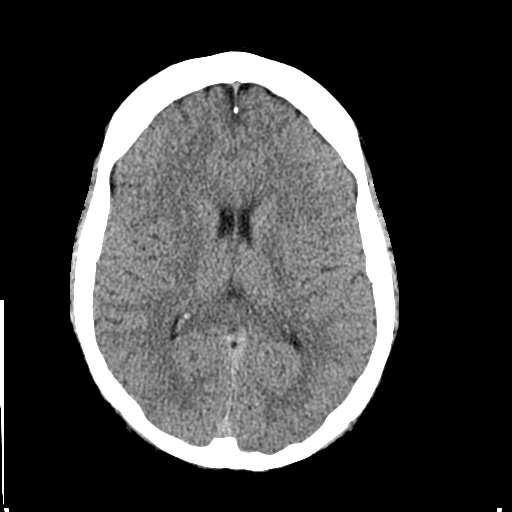
[im 17/32  brain]
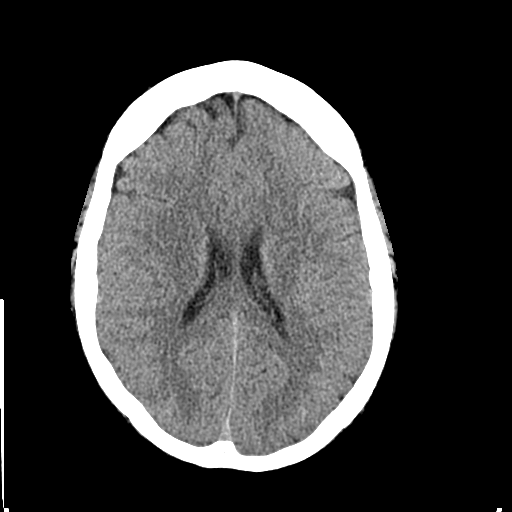
[im 17/32  bone]
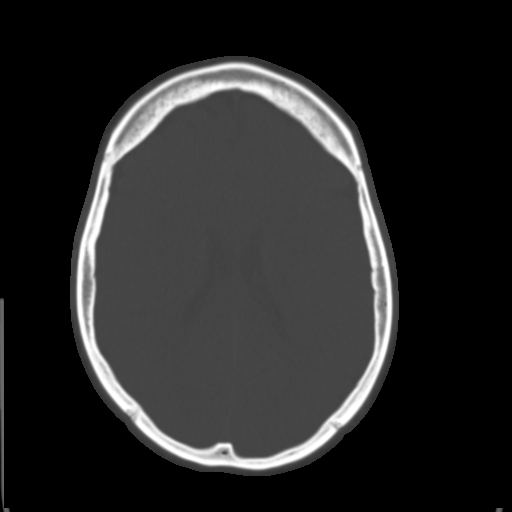
[im 19/32  brain]
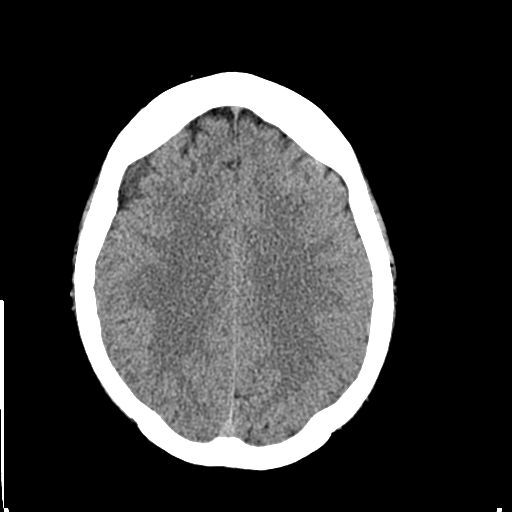
[im 21/32  brain]
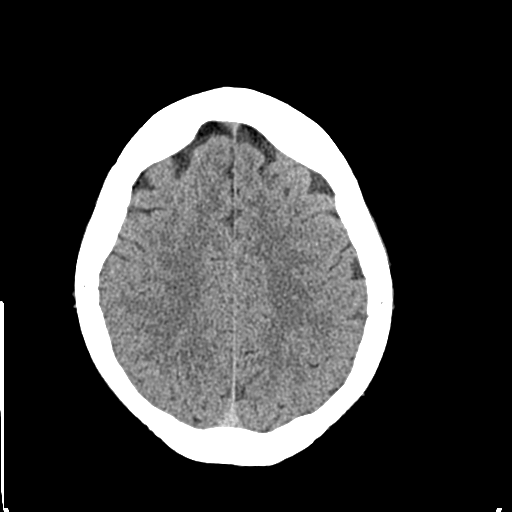
[im 22/32  brain]
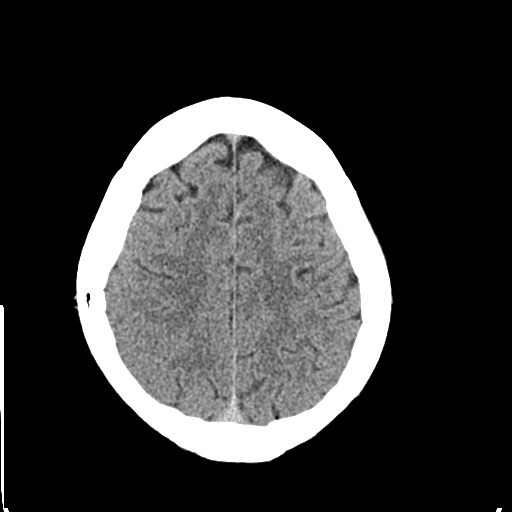
[im 25/32  brain]
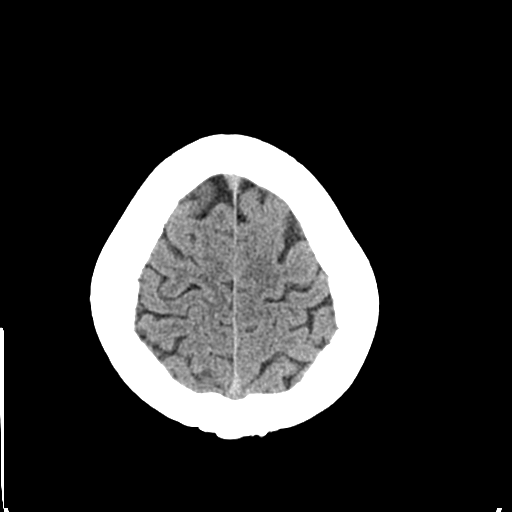
[im 25/32  bone]
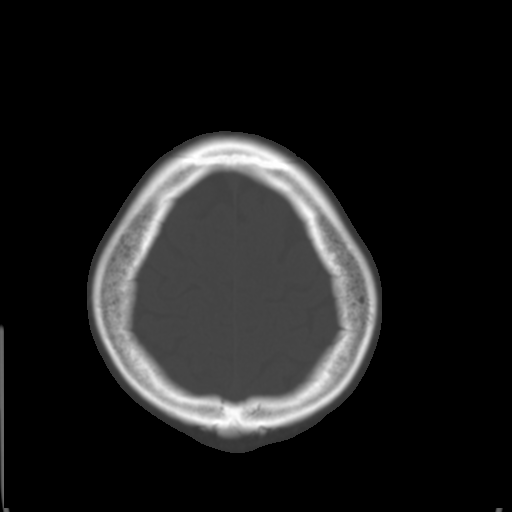
[im 26/32  brain]
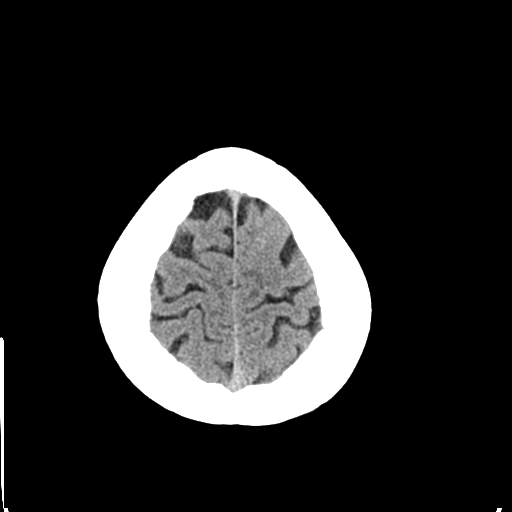
[im 27/32  brain]
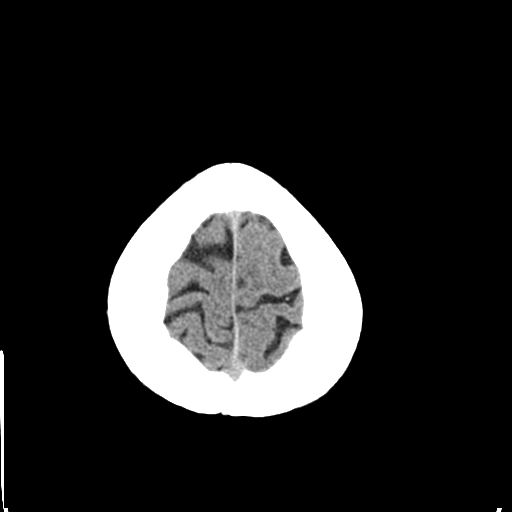
[im 30/32  brain]
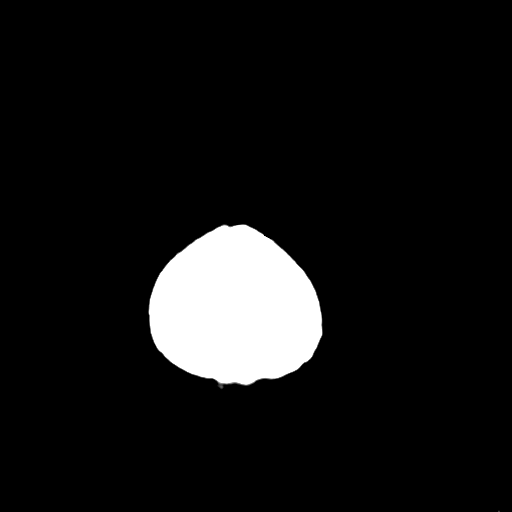

[16 of 30 positions shown; findings below may reference images not displayed]

FINDINGS: The brain appears normal evidence of infarct, hemorrhage,
mass lesion, mass effect, midline shift or abnormal extra-axial
fluid collection.  There is no hydrocephalus or pneumocephalus.
The calvarium is intact.
IMPRESSION: Negative exam.

## 2012-12-23 ENCOUNTER — Ambulatory Visit (INDEPENDENT_AMBULATORY_CARE_PROVIDER_SITE_OTHER): Payer: BC Managed Care – PPO | Admitting: Internal Medicine

## 2012-12-23 VITALS — BP 122/86 | HR 127 | Temp 97.7°F | Wt 268.0 lb

## 2012-12-23 DIAGNOSIS — R0989 Other specified symptoms and signs involving the circulatory and respiratory systems: Secondary | ICD-10-CM

## 2012-12-23 DIAGNOSIS — R05 Cough: Secondary | ICD-10-CM

## 2012-12-23 DIAGNOSIS — R0600 Dyspnea, unspecified: Secondary | ICD-10-CM

## 2012-12-23 DIAGNOSIS — R059 Cough, unspecified: Secondary | ICD-10-CM

## 2012-12-23 DIAGNOSIS — R000 Tachycardia, unspecified: Secondary | ICD-10-CM

## 2012-12-23 DIAGNOSIS — J069 Acute upper respiratory infection, unspecified: Secondary | ICD-10-CM

## 2012-12-23 LAB — POCT INFLUENZA A/B: Influenza A, POC: NEGATIVE

## 2012-12-23 LAB — D-DIMER, QUANTITATIVE: D-Dimer, Quant: 0.36 ug/mL-FEU (ref 0.00–0.48)

## 2012-12-23 LAB — CBC WITH DIFFERENTIAL/PLATELET
Basophils Relative: 0.5 % (ref 0.0–3.0)
Eosinophils Absolute: 0.1 10*3/uL (ref 0.0–0.7)
Eosinophils Relative: 1.1 % (ref 0.0–5.0)
Lymphocytes Relative: 17.7 % (ref 12.0–46.0)
Neutrophils Relative %: 74.8 % (ref 43.0–77.0)
Platelets: 312 10*3/uL (ref 150.0–400.0)
RBC: 4.93 Mil/uL (ref 4.22–5.81)
WBC: 7.1 10*3/uL (ref 4.5–10.5)

## 2012-12-23 LAB — BASIC METABOLIC PANEL
BUN: 21 mg/dL (ref 6–23)
Creatinine, Ser: 1.2 mg/dL (ref 0.4–1.5)
GFR: 68.04 mL/min (ref >60.00)
Glucose, Bld: 89 mg/dL (ref 70–99)
Potassium: 3.7 mEq/L (ref 3.5–5.1)

## 2012-12-23 LAB — CK TOTAL AND CKMB (NOT AT ARMC)
CK, MB: 0.8 ng/mL (ref 0.3–4.0)
Total CK: 43 U/L (ref 7–232)

## 2012-12-23 MED ORDER — FLUTICASONE PROPIONATE 50 MCG/ACT NA SUSP: 1.0000 | Freq: Two times a day (BID) | NASAL | Status: DC | PRN

## 2012-12-23 MED ORDER — AMOXICILLIN 500 MG PO CAPS: 500.0000 mg | ORAL_CAPSULE | Freq: Three times a day (TID) | ORAL | Status: DC

## 2012-12-23 NOTE — Progress Notes (Signed)
  Subjective:    Patient ID: Raymond Mathews, male    DOB: 1966-01-14, 47 y.o.   MRN: [id number]  HPI The respiratory tract symptoms began  12/21/12 as sore throat ,  head congestion ,chest congestion  & cough with scant yellow in the morning.  Significant active  associated symptoms include frontal headache, facial pain, sore throat,& nasal purulence.   Cough is not associated with wheezing . He awoke dyspneic this am.Sleep study to be scheduled.He denies chest pain or dyspnea now.   Chills have been   present   Extrinsic symptoms of itchy eyes  and sneezing were present .    Myalgias and arthralgias were present in L thorax  Flu shot not current        No treatment to date   There is no history of asthma or seasonal or perennial allergies. The patient quit smoking 08/05/2000                Review of Systems Symptoms not present include dental pain, earache , and otic discharge. Fever, & sweats not present  Watery eyes were not noted.     Objective:   Physical Exam General appearance:good health ;well nourished; no acute distress or increased work of breathing is present.  No  lymphadenopathy about the head, neck, or axilla noted.  Eyes: No conjunctival inflammation or lid edema is present. There is no scleral icterus.EOMI & vision intact Ears:  External ear exam shows no significant lesions or deformities.  Otoscopic examination reveals clear canals, tympanic membranes are intact bilaterally without bulging, retraction, inflammation or discharge.TMs dull Nose:  External nasal examination shows no deformity or inflammation. Nasal mucosa are erythematous & dry  without lesions or exudates. No septal dislocation or deviation.No obstruction to airflow.  Oral exam: Dental hygiene is good; lips and gums are healthy appearing.There is oropharyngeal erythema ; no exudate noted.  Neck:  No deformities, thyromegaly, masses, or tenderness noted.   Decreased flexion but  no meningismus.  Heart:  Tachycardia with regular rhythm. S1 and S2 normal without gallop, murmur, click, rub or other extra sounds.  Lungs:Chest clear to auscultation; no wheezes, rhonchi,rales ,or rubs present.No increased work of breathing.   Extremities:  No cyanosis, edema, or clubbing  noted . Homan's negative Skin: Warm & dry w/o jaundice or tenting.          Assessment & Plan:  #1 upper respiratory tract infection and cough with scant purulent sputum. Nasal swab was negative for influenza. #2 paroxysmal nocturnal dyspnea this morning  #3 persistent tachycardia  Plan: See orders and recommendations

## 2012-12-23 NOTE — Patient Instructions (Addendum)
If you activate My Chart; the results can be released to you as soon as they populate from the lab. If you choose not to use this program; the labs have to be reviewed, copied & mailed   causing a delay in getting the results to you.   Plain Mucinex (NOT D) for thick secretions ;force NON dairy fluids .   Nasal cleansing in the shower as discussed with lather of mild shampoo.After 10 seconds wash off lather while  exhaling through nostrils. Make sure that all residual soap is removed to prevent irritation.  Fluticasone 1 spray in each nostril twice a day as needed. Use the "crossover" technique into opposite nostril spraying toward opposite ear @ 45 degree angle, not straight up into nostril.  Use a Neti pot daily only  as needed for significant sinus congestion; going from open side to congested side . Plain Allegra (NOT D )  160 daily , Loratidine 10 mg , OR Zyrtec 10 mg @ bedtime  as needed for itchy eyes & sneezing. To prevent rapid heart rhythm or palpitations ( premature beats), avoid stimulants such as decongestants, diet pills, nicotine, or caffeine (coffee, tea, cola, or chocolate) to excess.  Review and correct the record as indicated. Please share record with all medical staff seen.

## 2012-12-24 ENCOUNTER — Telehealth: Payer: Self-pay | Admitting: Family

## 2012-12-24 NOTE — Telephone Encounter (Signed)
Patient is requesting results from yesterdays visit with Dr. Alwyn Ren. Also, he states that his heart rate keeps going up. He says that his heart rate was 123 this morning.

## 2012-12-24 NOTE — Telephone Encounter (Signed)
Labs look good. Cardiac testing normal, thyroid normal, blood count normal.  Make sure he is drinking enough water (6-8 glasses a day), avoid products containing sudafed.  Go to ER if rate >150 over weekend at rest.  If rate >110 on Monday, needs follow up office visit.

## 2012-12-24 NOTE — Telephone Encounter (Signed)
Notified pt and he voices understanding. 

## 2013-01-17 ENCOUNTER — Encounter: Payer: Self-pay | Admitting: Family

## 2013-01-17 ENCOUNTER — Other Ambulatory Visit: Payer: Self-pay | Admitting: Family

## 2013-01-17 ENCOUNTER — Ambulatory Visit (INDEPENDENT_AMBULATORY_CARE_PROVIDER_SITE_OTHER): Payer: BC Managed Care – PPO | Admitting: Family

## 2013-01-17 VITALS — BP 120/90 | HR 111 | Temp 98.4°F | Resp 16 | Wt 267.0 lb

## 2013-01-17 DIAGNOSIS — R0609 Other forms of dyspnea: Secondary | ICD-10-CM

## 2013-01-17 DIAGNOSIS — I10 Essential (primary) hypertension: Secondary | ICD-10-CM

## 2013-01-17 DIAGNOSIS — R0683 Snoring: Secondary | ICD-10-CM

## 2013-01-17 DIAGNOSIS — R002 Palpitations: Secondary | ICD-10-CM

## 2013-01-17 DIAGNOSIS — R0989 Other specified symptoms and signs involving the circulatory and respiratory systems: Secondary | ICD-10-CM

## 2013-01-17 DIAGNOSIS — J069 Acute upper respiratory infection, unspecified: Secondary | ICD-10-CM | POA: Insufficient documentation

## 2013-01-17 DIAGNOSIS — N529 Male erectile dysfunction, unspecified: Secondary | ICD-10-CM

## 2013-01-17 NOTE — Progress Notes (Signed)
Subjective:    Patient ID: Raymond Mathews, male    DOB: [birth year], 47 y.o.   MRN: [id number]  HPI  Raymond Mathews is a 47 yr old male who presents today with two concerns.    1) Cough- reports 2-3 day hx of cough/nasal congestion. Denies associated fever. Taking cough drops with some improvement.  Reports "burning in chest."    2) HTN/Tachycardia-  Reports that he woke up out of a sleep he woke up with HR 120 BP 150/112.   Reports that he wakes himself up at night.  He continues tribenzor.  Reports AM coffee.     Review of Systems See HPI  Past Medical History  Diagnosis Date  . GERD (gastroesophageal reflux disease)   . Anxiety   . Neck pain   . Tobacco abuse     history of  . Clavicle fracture 04/2008    left comminuted clavicle fracture (complicated by wound infection)  . Cervical disc disease   . Varicose veins   . Gastritis   . Chest pain     treadmill 12/17/10--no ischemia and no arrhythmia  . Palpitations   . Pre-syncope     Echo and treadmill 12/17/10-- no further workup. EF 60%.  Diastolic dysfunction  . Diastolic dysfunction 12/17/10    by Echo    History   Social History  . Marital Status: Single    Spouse Name: N/A    Number of Children: 2  . Years of Education: N/A   Occupational History  . OWNER     Holiday representative   Social History Main Topics  . Smoking status: Former Smoker    Quit date: 08/05/2000  . Smokeless tobacco: Never Used  . Alcohol Use: No  . Drug Use: No  . Sexually Active: Not on file   Other Topics Concern  . Not on file   Social History Narrative      Single  has two boys   Occupation:  Self-employed in Chief Operating Officer   Former Smoker quit in 2001   Alcohol use-no    Drug use-no      Poor diet - fast food    Daily Caffeine Use-coffee  1 cup coffee daily.            Past Surgical History  Procedure Laterality Date  . Fracture surgery  04/2008    ORIF left clavicle using Katrinka Blazing and Matthew anatomic clavicle plate.  Plate later removed.  . Repair of clavical      Family History  Problem Relation Age of Onset  . Cancer Mother     uterine  . Thyroid disease Mother   . Stroke Father   . Diabetes Other   . Heart disease Other   . Cancer Other     colon cancer    No Known Allergies  Current Outpatient Prescriptions on File Prior to Visit  Medication Sig Dispense Refill  . fluticasone (FLONASE) 50 MCG/ACT nasal spray Place 1 spray into the nose 2 (two) times daily as needed for rhinitis.  16 g  2  . Olmesartan-Amlodipine-HCTZ (TRIBENZOR) 20-5-12.5 MG TABS Take 1 tablet by mouth daily.  30 tablet  2  . omeprazole (PRILOSEC) 40 MG capsule Take 1 capsule (40 mg total) by mouth daily.  30 capsule  5   No current facility-administered medications on file prior to visit.    BP 120/90  Pulse 111  Temp(Src) 98.4 F (36.9 C) (Oral)  Resp 16  Wt 267 lb 0.6  oz (121.129 kg)  BMI 32.93 kg/m2  SpO2 95%       Objective:   Physical Exam  Constitutional: He is oriented to person, place, and time. He appears well-developed and well-nourished. No distress.  Cardiovascular: Normal rate and regular rhythm.   No murmur heard. Pulmonary/Chest: Effort normal and breath sounds normal. No respiratory distress. He has no wheezes. He has no rales. He exhibits no tenderness.  Musculoskeletal: He exhibits no edema.  Neurological: He is alert and oriented to person, place, and time.  Skin: Skin is warm and dry.  Psychiatric: He has a normal mood and affect. His behavior is normal. Judgment and thought content normal.          Assessment & Plan:

## 2013-01-17 NOTE — Assessment & Plan Note (Signed)
Resolved.  Reports that this only occurred as a side effect of bystolic.

## 2013-01-17 NOTE — Assessment & Plan Note (Signed)
He is instructed to call if symptoms worsen, or if not improved in 2-3 days.

## 2013-01-17 NOTE — Patient Instructions (Addendum)
Please complete your blood work prior to leaving. Follow up in 6 weeks. Call if you develop recurrent palpitations.

## 2013-01-17 NOTE — Assessment & Plan Note (Signed)
BP Readings from Last 3 Encounters:  01/17/13 120/90  12/23/12 122/86  11/12/12 130/90   BP looks good today.  He had eval last visit by Dr. Alwyn Ren for tachycardia which included a d dimer (neg), EKG normal.  TSH low normal. Will repeat TSH today.

## 2013-01-18 ENCOUNTER — Encounter: Payer: Self-pay | Admitting: Family

## 2013-02-12 ENCOUNTER — Other Ambulatory Visit: Payer: Self-pay | Admitting: Family

## 2013-02-24 ENCOUNTER — Telehealth: Payer: Self-pay | Admitting: Family

## 2013-02-24 DIAGNOSIS — R400 Somnolence: Secondary | ICD-10-CM

## 2013-02-24 NOTE — Telephone Encounter (Signed)
Pls call pt and let him know that [employer] denied home sleep study. I will see if we can get it approved in lab.

## 2013-02-25 NOTE — Telephone Encounter (Signed)
Notified pt and he is agreeable to proceed with sleep lab.

## 2013-03-14 ENCOUNTER — Other Ambulatory Visit: Payer: Self-pay | Admitting: Family

## 2013-03-22 ENCOUNTER — Ambulatory Visit (HOSPITAL_BASED_OUTPATIENT_CLINIC_OR_DEPARTMENT_OTHER): Payer: BC Managed Care – PPO

## 2013-04-12 ENCOUNTER — Encounter: Payer: Self-pay | Admitting: Family

## 2013-04-12 ENCOUNTER — Ambulatory Visit (INDEPENDENT_AMBULATORY_CARE_PROVIDER_SITE_OTHER): Payer: BC Managed Care – PPO | Admitting: Family

## 2013-04-12 VITALS — BP 126/88 | HR 67 | Temp 97.8°F | Resp 18 | Ht 75.5 in | Wt 265.1 lb

## 2013-04-12 DIAGNOSIS — I10 Essential (primary) hypertension: Secondary | ICD-10-CM

## 2013-04-12 DIAGNOSIS — R400 Somnolence: Secondary | ICD-10-CM

## 2013-04-12 DIAGNOSIS — R404 Transient alteration of awareness: Secondary | ICD-10-CM

## 2013-04-12 NOTE — Progress Notes (Signed)
Subjective:    Patient ID: Raymond Mathews, male    DOB: 06/07/1966, 47 y.o.   MRN: [id number]  HPI  Raymond Mathews is a 47 yr old male who presents today to discuss fluctuating blood pressures. Reports max dbp was 98.  Started walking every night x 1 hour.  This seems to help his blood pressure.  He reports associated daily headache x 2 weeks.  Reports pressure in the ears and behind the eyes.  He has hx of migraines.  Never completed sleep study-  [employer] would not cover home study.  Daytime fatigue has gotten better, though sometimes sleepy in the afternoon.    Review of Systems    see HPI  Past Medical History  Diagnosis Date  . GERD (gastroesophageal reflux disease)   . Anxiety   . Neck pain   . Tobacco abuse     history of  . Clavicle fracture 04/2008    left comminuted clavicle fracture (complicated by wound infection)  . Cervical disc disease   . Varicose veins   . Gastritis   . Chest pain     treadmill 12/17/10--no ischemia and no arrhythmia  . Palpitations   . Pre-syncope     Echo and treadmill 12/17/10-- no further workup. EF 60%.  Diastolic dysfunction  . Diastolic dysfunction 12/17/10    by Echo    History   Social History  . Marital Status: Single    Spouse Name: N/A    Number of Children: 2  . Years of Education: N/A   Occupational History  . OWNER     Holiday representative   Social History Main Topics  . Smoking status: Former Smoker    Quit date: 08/05/2000  . Smokeless tobacco: Never Used  . Alcohol Use: No  . Drug Use: No  . Sexually Active: Not on file   Other Topics Concern  . Not on file   Social History Narrative      Single  has two boys   Occupation:  Self-employed in Chief Operating Officer   Former Smoker quit in 2001   Alcohol use-no    Drug use-no      Poor diet - fast food    Daily Caffeine Use-coffee  1 cup coffee daily.            Past Surgical History  Procedure Laterality Date  . Fracture surgery  04/2008    ORIF left clavicle using  Raymond Mathews anatomic clavicle plate. Plate later removed.  . Repair of clavical      Family History  Problem Relation Age of Onset  . Cancer Mother     uterine  . Thyroid disease Mother   . Stroke Father   . Diabetes Other   . Heart disease Other   . Cancer Other     colon cancer    No Known Allergies  Current Outpatient Prescriptions on File Prior to Visit  Medication Sig Dispense Refill  . fluticasone (FLONASE) 50 MCG/ACT nasal spray Place 1 spray into the nose 2 (two) times daily as needed for rhinitis.  16 g  2  . omeprazole (PRILOSEC) 40 MG capsule TAKE ONE CAPSULE BY MOUTH EVERY DAY  30 capsule  5  . TRIBENZOR 20-5-12.5 MG TABS TAKE 1 TABLET BY MOUTH DAILY.  30 tablet  2   No current facility-administered medications on file prior to visit.    BP 126/88  Pulse 67  Temp(Src) 97.8 F (36.6 C) (Oral)  Resp 18  Ht 6' 3.5" (1.918 m)  Wt 265 lb 1.3 oz (120.239 kg)  BMI 32.68 kg/m2  SpO2 97%    Objective:   Physical Exam  Constitutional: He is oriented to person, place, and time. He appears well-developed and well-nourished. No distress.  Cardiovascular: Normal rate and regular rhythm.   No murmur heard. Pulmonary/Chest: Effort normal and breath sounds normal. No respiratory distress. He has no wheezes. He has no rales. He exhibits no tenderness.  Musculoskeletal: He exhibits no edema.  Neurological: He is alert and oriented to person, place, and time.  Psychiatric: He has a normal mood and affect. His behavior is normal. Judgment and thought content normal.          Assessment & Plan:

## 2013-04-12 NOTE — Patient Instructions (Signed)
You will be contact about your sleep study.  Please let us know if you have not heard back within 1 week about your referral. Start Zyrtec 10mg  once daily. Please schedule a follow up appointment in 3 months.

## 2013-04-14 NOTE — Assessment & Plan Note (Addendum)
BP looks good today.  Commended him for his exercise routine.  My clinical suspicion for OSA remains high.  If he does have OSA, this could be contributing to his HTN and HA's. Ordered split night study in sleep lab.

## 2013-05-09 ENCOUNTER — Ambulatory Visit (HOSPITAL_BASED_OUTPATIENT_CLINIC_OR_DEPARTMENT_OTHER): Payer: BC Managed Care – PPO

## 2013-05-16 ENCOUNTER — Encounter (HOSPITAL_BASED_OUTPATIENT_CLINIC_OR_DEPARTMENT_OTHER): Payer: BC Managed Care – PPO

## 2013-07-05 ENCOUNTER — Ambulatory Visit (INDEPENDENT_AMBULATORY_CARE_PROVIDER_SITE_OTHER): Payer: BC Managed Care – PPO | Admitting: Physician Assistant

## 2013-07-05 ENCOUNTER — Encounter: Payer: Self-pay | Admitting: Physician Assistant

## 2013-07-05 VITALS — BP 146/88 | HR 106 | Temp 98.1°F | Resp 18 | Wt 267.5 lb

## 2013-07-05 DIAGNOSIS — R5383 Other fatigue: Secondary | ICD-10-CM | POA: Insufficient documentation

## 2013-07-05 DIAGNOSIS — R5381 Other malaise: Secondary | ICD-10-CM

## 2013-07-05 DIAGNOSIS — F419 Anxiety disorder, unspecified: Secondary | ICD-10-CM

## 2013-07-05 DIAGNOSIS — F411 Generalized anxiety disorder: Secondary | ICD-10-CM

## 2013-07-05 MED ORDER — ESCITALOPRAM OXALATE 10 MG PO TABS: 10.0000 mg | ORAL_TABLET | Freq: Every day | ORAL | Status: DC

## 2013-07-05 NOTE — Progress Notes (Signed)
Patient ID: Raymond Mathews, male   DOB: 11/15/1966, 47 y.o.   MRN: [id number]   Patient is a 47 year-old caucasian male w/ history of anxiety who presents to clinic today following an episode of lightheadedness, tingling in hand, and chest tightness.  Patient has been seen for this several times in the past and diagnosed with anxiety. Patient has not been receptive to therapy for his anxiety in the past -- either has experienced side effects, did not take medication as prescribed, or refused medication.  Patient states that he is willing to try medication now to manage his symptoms.  Patient endorses significant stressors with his job.  Patient is a single father.  Patient also has history of fatigue, snoring, increase in weight, and waking up suddenly at night feeling like he couldn't breathe.  Patient has also been seen in the past for this issue.  Patient was referred for sleep study but never followed-through with it.  Patient does have hx of HTN that is controlled with pharmacotherapy.  Patient has had several workups in the past for these episodes of anxiety -- TSH, CBC, BMP, Troponin (all which were negative), EKG w/ sinus tachycardia.  Past Medical History  Diagnosis Date  . GERD (gastroesophageal reflux disease)   . Anxiety   . Neck pain   . Tobacco abuse     history of  . Clavicle fracture 04/2008    left comminuted clavicle fracture (complicated by wound infection)  . Cervical disc disease   . Varicose veins   . Gastritis   . Chest pain     treadmill 12/17/10--no ischemia and no arrhythmia  . Palpitations   . Pre-syncope     Echo and treadmill 12/17/10-- no further workup. EF 60%.  Diastolic dysfunction  . Diastolic dysfunction 12/17/10    by Echo   Current Outpatient Prescriptions on File Prior to Visit  Medication Sig Dispense Refill  . fluticasone (FLONASE) 50 MCG/ACT nasal spray Place 1 spray into the nose 2 (two) times daily as needed for rhinitis.  16 g  2  . omeprazole  (PRILOSEC) 40 MG capsule TAKE ONE CAPSULE BY MOUTH EVERY DAY  30 capsule  5  . TRIBENZOR 20-5-12.5 MG TABS TAKE 1 TABLET BY MOUTH DAILY.  30 tablet  2   No current facility-administered medications on file prior to visit.   No Known Allergies Family History  Problem Relation Age of Onset  . Cancer Mother     uterine  . Thyroid disease Mother   . Stroke Father   . Diabetes Other   . Heart disease Other   . Cancer Other     colon cancer   History   Social History  . Marital Status: Single    Spouse Name: N/A    Number of Children: 2  . Years of Education: N/A   Occupational History  . OWNER     Holiday representative   Social History Main Topics  . Smoking status: Former Smoker    Quit date: 08/05/2000  . Smokeless tobacco: Never Used  . Alcohol Use: No  . Drug Use: No  . Sexually Active: None   Other Topics Concern  . None   Social History Narrative      Single  has two boys   Occupation:  Self-employed in Chief Operating Officer   Former Smoker quit in 2001   Alcohol use-no    Drug use-no      Poor diet - fast food  Daily Caffeine Use-coffee  1 cup coffee daily.           Review of Systems  Constitutional: Positive for malaise/fatigue. Negative for fever, chills, weight loss and diaphoresis.  Eyes: Negative for blurred vision and double vision.  Cardiovascular: Positive for palpitations. Negative for chest pain.  Neurological: Positive for tingling and headaches. Negative for dizziness, sensory change, speech change, seizures and loss of consciousness.       + lightheadedness  Psychiatric/Behavioral: The patient is nervous/anxious.    Filed Vitals:   07/05/13 1511  BP: 146/88  Pulse: 106  Temp: 98.1 F (36.7 C)  Resp: 18   Physical Exam  Constitutional: He is oriented to person, place, and time and well-developed, well-nourished, and in no distress.  HENT:  Head: Normocephalic and atraumatic.  Right Ear: External ear normal.  Left Ear: External ear  normal.  Nose: Nose normal.  Mouth/Throat: Oropharynx is clear and moist. No oropharyngeal exudate.  TMs WNL  Eyes: Conjunctivae are normal. Pupils are equal, round, and reactive to light.  Neck: Normal range of motion. Neck supple. No thyromegaly present.  Cardiovascular: Regular rhythm, normal heart sounds and intact distal pulses.   No murmur heard. Tachycardic   Pulmonary/Chest: Effort normal and breath sounds normal. No respiratory distress. He has no wheezes. He has no rales. He exhibits no tenderness.  Lymphadenopathy:    He has no cervical adenopathy.  Neurological: He is alert and oriented to person, place, and time. No cranial nerve deficit.  Skin: Skin is warm and dry. No rash noted.   Assessment/Plan: ANXIETY DISORDER, GENERALIZED Patient wishes to restart medication.  Trial of escitalopram 10 mg QD.  Patient educated on potential side effects.  Return in 1 month for evaluation.  Fatigue Patient has sings/symptoms indicative or possible OSA including somnolence throughout the day, restless sleep, weight gain, snoring.  Referral made for sleep study to evaluate for OSA.  Patient with hx of low testosterone that is managed by Urology.

## 2013-07-05 NOTE — Assessment & Plan Note (Addendum)
Patient has sings/symptoms indicative or possible OSA including somnolence throughout the day, restless sleep, weight gain, snoring.  Referral made for sleep study to evaluate for OSA.  Patient with hx of low testosterone that is managed by Urology.

## 2013-07-05 NOTE — Patient Instructions (Signed)
Will call you with referral for sleep study to evaluate for sleep apnea.  Please take escitalopram as prescribed.  Follow-up with Melissa or myself in 1 month.   Sleep Apnea  Sleep apnea is a sleep disorder characterized by abnormal pauses in breathing while you sleep. When your breathing pauses, the level of oxygen in your blood decreases. This causes you to move out of deep sleep and into light sleep. As a result, your quality of sleep is poor, and the system that carries your blood throughout your body (cardiovascular system) experiences stress. If sleep apnea remains untreated, the following conditions can develop:  High blood pressure (hypertension).  Coronary artery disease.  Inability to achieve or maintain an erection (impotence).  Impairment of your thought process (cognitive dysfunction). There are three types of sleep apnea: 1. Obstructive sleep apnea Pauses in breathing during sleep because of a blocked airway. 2. Central sleep apnea Pauses in breathing during sleep because the area of the brain that controls your breathing does not send the correct signals to the muscles that control breathing. 3. Mixed sleep apnea A combination of both obstructive and central sleep apnea. RISK FACTORS The following risk factors can increase your risk of developing sleep apnea:  Being overweight.  Smoking.  Having narrow passages in your nose and throat.  Being of older age.  Being male.  Alcohol use.  Sedative and tranquilizer use.  Ethnicity. Among individuals younger than 35 years, African Americans are at increased risk of sleep apnea. SYMPTOMS   Difficulty staying asleep.  Daytime sleepiness and fatigue.  Loss of energy.  Irritability.  Loud, heavy snoring.  Morning headaches.  Trouble concentrating.  Forgetfulness.  Decreased interest in sex. DIAGNOSIS  In order to diagnose sleep apnea, your caregiver will perform a physical examination. Your caregiver may  suggest that you take a home sleep test. Your caregiver may also recommend that you spend the night in a sleep lab. In the sleep lab, several monitors record information about your heart, lungs, and brain while you sleep. Your leg and arm movements and blood oxygen level are also recorded. TREATMENT The following actions may help to resolve mild sleep apnea:  Sleeping on your side.   Using a decongestant if you have nasal congestion.   Avoiding the use of depressants, including alcohol, sedatives, and narcotics.   Losing weight and modifying your diet if you are overweight. There also are devices and treatments to help open your airway:  Oral appliances. These are custom-made mouthpieces that shift your lower jaw forward and slightly open your bite. This opens your airway.  Devices that create positive airway pressure. This positive pressure "splints" your airway open to help you breathe better during sleep. The following devices create positive airway pressure:  Continuous positive airway pressure (CPAP) device. The CPAP device creates a continuous level of air pressure with an air pump. The air is delivered to your airway through a mask while you sleep. This continuous pressure keeps your airway open.  Nasal expiratory positive airway pressure (EPAP) device. The EPAP device creates positive air pressure as you exhale. The device consists of single-use valves, which are inserted into each nostril and held in place by adhesive. The valves create very little resistance when you inhale but create much more resistance when you exhale. That increased resistance creates the positive airway pressure. This positive pressure while you exhale keeps your airway open, making it easier to breath when you inhale again.  Bilevel positive airway pressure (BPAP)  device. The BPAP device is used mainly in patients with central sleep apnea. This device is similar to the CPAP device because it also uses an air  pump to deliver continuous air pressure through a mask. However, with the BPAP machine, the pressure is set at two different levels. The pressure when you exhale is lower than the pressure when you inhale.  Surgery. Typically, surgery is only done if you cannot comply with less invasive treatments or if the less invasive treatments do not improve your condition. Surgery involves removing excess tissue in your airway to create a wider passage way. Document Released: 10/31/2002 Document Revised: 05/11/2012 Document Reviewed: 03/18/2012 [facility] Patient Information 2014 [place], [place].

## 2013-07-05 NOTE — Assessment & Plan Note (Signed)
Patient wishes to restart medication.  Trial of escitalopram 10 mg QD.  Patient educated on potential side effects.  Return in 1 month for evaluation.

## 2013-07-12 ENCOUNTER — Other Ambulatory Visit: Payer: Self-pay | Admitting: Family

## 2013-08-03 ENCOUNTER — Other Ambulatory Visit: Payer: Self-pay | Admitting: Family

## 2013-08-09 ENCOUNTER — Ambulatory Visit: Payer: BC Managed Care – PPO | Admitting: Physician Assistant

## 2013-08-09 DIAGNOSIS — Z0289 Encounter for other administrative examinations: Secondary | ICD-10-CM

## 2013-08-12 ENCOUNTER — Other Ambulatory Visit: Payer: Self-pay | Admitting: Family

## 2013-11-08 ENCOUNTER — Other Ambulatory Visit: Payer: Self-pay | Admitting: Physician Assistant

## 2013-11-08 NOTE — Telephone Encounter (Signed)
Will send one month supply pt needs appt to continue

## 2013-11-14 ENCOUNTER — Ambulatory Visit (INDEPENDENT_AMBULATORY_CARE_PROVIDER_SITE_OTHER): Payer: BC Managed Care – PPO | Admitting: Family

## 2013-11-14 ENCOUNTER — Encounter: Payer: Self-pay | Admitting: Family

## 2013-11-14 VITALS — BP 120/90 | HR 109 | Temp 97.8°F | Resp 18 | Ht 75.5 in | Wt 268.1 lb

## 2013-11-14 DIAGNOSIS — J029 Acute pharyngitis, unspecified: Secondary | ICD-10-CM

## 2013-11-14 DIAGNOSIS — J069 Acute upper respiratory infection, unspecified: Secondary | ICD-10-CM

## 2013-11-14 LAB — POCT RAPID STREP A (OFFICE): Rapid Strep A Screen: NEGATIVE

## 2013-11-14 LAB — POCT INFLUENZA A/B
Influenza A, POC: NEGATIVE
Influenza B, POC: NEGATIVE

## 2013-11-14 NOTE — Progress Notes (Signed)
Pre visit review using our clinic review tool, if applicable. No additional management support is needed unless otherwise documented below in the visit note. 

## 2013-11-14 NOTE — Progress Notes (Signed)
Subjective:    Patient ID: Raymond Mathews, male    DOB: September 28, 1966, 47 y.o.   MRN: [id number]  HPI  Raymond Mathews is a 47 yr old male who presents today with chief complaint of sore throat.  Symptoms started 3-4 days ago.  Reports + nasal congestion.  Tried mucinex yesterday. + cough.  Feels like "chest is on fire when I cough."  He denies associated fever.  Reports that his son was sick recently.   Review of Systems See HPI  Past Medical History  Diagnosis Date  . GERD (gastroesophageal reflux disease)   . Anxiety   . Neck pain   . Tobacco abuse     history of  . Clavicle fracture 04/2008    left comminuted clavicle fracture (complicated by wound infection)  . Cervical disc disease   . Varicose veins   . Gastritis   . Chest pain     treadmill 12/17/10--no ischemia and no arrhythmia  . Palpitations   . Pre-syncope     Echo and treadmill 12/17/10-- no further workup. EF 60%.  Diastolic dysfunction  . Diastolic dysfunction 12/17/10    by Echo    History   Social History  . Marital Status: Single    Spouse Name: N/A    Number of Children: 2  . Years of Education: N/A   Occupational History  . OWNER     Holiday representative   Social History Main Topics  . Smoking status: Former Smoker    Quit date: 08/05/2000  . Smokeless tobacco: Never Used  . Alcohol Use: No  . Drug Use: No  . Sexual Activity: Not on file   Other Topics Concern  . Not on file   Social History Narrative      Single  has two boys   Occupation:  Self-employed in Chief Operating Officer   Former Smoker quit in 2001   Alcohol use-no    Drug use-no      Poor diet - fast food    Daily Caffeine Use-coffee  1 cup coffee daily.            Past Surgical History  Procedure Laterality Date  . Fracture surgery  04/2008    ORIF left clavicle using Katrinka Blazing and Matthew anatomic clavicle plate. Plate later removed.  . Repair of clavical      Family History  Problem Relation Age of Onset  . Cancer Mother    uterine  . Thyroid disease Mother   . Stroke Father   . Diabetes Other   . Heart disease Other   . Cancer Other     colon cancer    No Known Allergies  Current Outpatient Prescriptions on File Prior to Visit  Medication Sig Dispense Refill  . escitalopram (LEXAPRO) 10 MG tablet TAKE 1 TABLET BY MOUTH EVERY DAY  30 tablet  0  . omeprazole (PRILOSEC) 40 MG capsule TAKE ONE CAPSULE BY MOUTH EVERY DAY  30 capsule  5  . TRIBENZOR 20-5-12.5 MG TABS TAKE 1 TABLET BY MOUTH DAILY.  30 tablet  2  . fluticasone (FLONASE) 50 MCG/ACT nasal spray Place 1 spray into the nose 2 (two) times daily as needed for rhinitis.  16 g  2   No current facility-administered medications on file prior to visit.    BP 120/90  Pulse 109  Temp(Src) 97.8 F (36.6 C) (Oral)  Resp 18  Ht 6' 3.5" (1.918 m)  Wt 268 lb 1.3 oz (121.6 kg)  BMI  33.05 kg/m2  SpO2 97%       Objective:   Physical Exam  Constitutional: He is oriented to person, place, and time. He appears well-developed and well-nourished. No distress.  HENT:  Head: Normocephalic and atraumatic.  Right Ear: Tympanic membrane and ear canal normal.  Left Ear: Tympanic membrane and ear canal normal.  Mouth/Throat: Posterior oropharyngeal erythema present. No oropharyngeal exudate or posterior oropharyngeal edema.  Cardiovascular: Normal rate and regular rhythm.   No murmur heard. Pulmonary/Chest: Effort normal and breath sounds normal. No respiratory distress. He has no wheezes. He has no rales. He exhibits no tenderness.  Neurological: He is alert and oriented to person, place, and time.  Psychiatric: He has a normal mood and affect. His behavior is normal. Judgment and thought content normal.          Assessment & Plan:

## 2013-11-14 NOTE — Patient Instructions (Signed)

## 2013-11-18 NOTE — Assessment & Plan Note (Signed)
Advised pt on supportive measures.  Rapid strep and flu swab both negative. Advised pt to contact us if symptoms worsen or if not improved in 2-3 days.

## 2013-12-04 ENCOUNTER — Other Ambulatory Visit: Payer: Self-pay | Admitting: Family

## 2013-12-11 ENCOUNTER — Other Ambulatory Visit: Payer: Self-pay | Admitting: Family

## 2014-01-06 ENCOUNTER — Other Ambulatory Visit: Payer: Self-pay | Admitting: Family

## 2014-01-11 ENCOUNTER — Other Ambulatory Visit: Payer: Self-pay | Admitting: Family

## 2014-02-10 ENCOUNTER — Other Ambulatory Visit: Payer: Self-pay | Admitting: Family

## 2014-03-11 ENCOUNTER — Other Ambulatory Visit: Payer: Self-pay | Admitting: Family

## 2014-03-13 NOTE — Telephone Encounter (Signed)
Due for follow up please

## 2014-03-13 NOTE — Telephone Encounter (Signed)
30 day supply x 1 refill of tribenzor sent to pharmacy.  Pt last seen in December for acute concern.  When should pt follow up in the office again?

## 2014-03-14 NOTE — Telephone Encounter (Signed)
Please call and schedule [facility]

## 2014-03-15 NOTE — Telephone Encounter (Signed)
Patient returned phone call and informed him of medication refill and he states that he will have to call back to schedule appointment

## 2014-03-15 NOTE — Telephone Encounter (Signed)
Left detailed message informing patient of medication refill and that he needs to be seen for addt refills

## 2014-04-11 ENCOUNTER — Other Ambulatory Visit: Payer: Self-pay | Admitting: Family

## 2014-06-02 ENCOUNTER — Telehealth: Payer: Self-pay | Admitting: Family

## 2014-06-02 MED ORDER — OLMESARTAN-AMLODIPINE-HCTZ 20-5-12.5 MG PO TABS: ORAL_TABLET | ORAL | Status: DC

## 2014-06-02 MED ORDER — ESCITALOPRAM OXALATE 10 MG PO TABS: ORAL_TABLET | ORAL | Status: DC

## 2014-06-02 MED ORDER — OMEPRAZOLE 40 MG PO CPDR: DELAYED_RELEASE_CAPSULE | ORAL | Status: DC

## 2014-06-02 NOTE — Telephone Encounter (Signed)
Patient is out of town on business and forgot his medication at home. He states that he will be back home on Monday and would like to know if Raymond KaufmannMelissa would send in enough medication to last him until Monday.   [facility] [address], [place] P: [phone] F: [phone]  tribenzor escitalopram omeprazole

## 2014-06-02 NOTE — Telephone Encounter (Signed)
Rxs sent, notified pt. 

## 2014-06-14 ENCOUNTER — Other Ambulatory Visit: Payer: Self-pay | Admitting: Family

## 2014-06-14 ENCOUNTER — Telehealth: Payer: Self-pay

## 2014-06-14 NOTE — Telephone Encounter (Signed)
Office contacted for refills.Pt needs to have an appointment to be seen by provider(Martin or Peggyann Juba'Sullivan) Pt was last seen in clinic 06/2013

## 2014-06-14 NOTE — Telephone Encounter (Signed)
Left detailed message informing patient of medication refill and that he needs to be seen before further refills can be given. °

## 2014-06-14 NOTE — Telephone Encounter (Signed)
I need to know what kind of medication and if/how many has been sent to pharmacy.

## 2014-06-14 NOTE — Telephone Encounter (Signed)
Sorry,Omeprazole 40 mg 30 day supply/0 Refills

## 2014-06-15 NOTE — Telephone Encounter (Signed)
Left message for patient to return my call.

## 2014-06-16 NOTE — Telephone Encounter (Signed)
Left message for patient to return my call.

## 2014-06-16 NOTE — Telephone Encounter (Signed)
Mailed letter °

## 2014-06-22 ENCOUNTER — Encounter: Payer: Self-pay | Admitting: Physician Assistant

## 2014-06-22 ENCOUNTER — Ambulatory Visit (INDEPENDENT_AMBULATORY_CARE_PROVIDER_SITE_OTHER): Payer: BC Managed Care – PPO | Admitting: Physician Assistant

## 2014-06-22 VITALS — BP 116/90 | HR 72 | Temp 97.9°F | Resp 16 | Ht 75.5 in | Wt 277.5 lb

## 2014-06-22 DIAGNOSIS — K219 Gastro-esophageal reflux disease without esophagitis: Secondary | ICD-10-CM

## 2014-06-22 DIAGNOSIS — K2270 Barrett's esophagus without dysplasia: Secondary | ICD-10-CM

## 2014-06-22 MED ORDER — OMEPRAZOLE 40 MG PO CPDR: DELAYED_RELEASE_CAPSULE | ORAL | Status: DC

## 2014-06-22 NOTE — Assessment & Plan Note (Signed)
Last EGD 3 years ago.  Currently on PPI therapy.  Recommended follow-up with GI.

## 2014-06-22 NOTE — Progress Notes (Signed)
Raymond Mathews presents to clinic today for medication management.  Raymond Mathews requesting refill of Omeprazole.  Raymond Mathews with history of GERD and Barrett's Esophagus.  Last EGD 3 years ago with Dr. Russella DarStark.  Was within normal limits.  Is overdue for follow-up.  Omeprazole give adequate relief of symptoms.  Denies N/V or epigastric pain.  Denies change to bowel habits.  Past Medical History  Diagnosis Date  . GERD (gastroesophageal reflux disease)   . Anxiety   . Neck pain   . Tobacco abuse     history of  . Clavicle fracture 04/2008    left comminuted clavicle fracture (complicated by wound infection)  . Cervical disc disease   . Varicose veins   . Gastritis   . Chest pain     treadmill 12/17/10--no ischemia and no arrhythmia  . Palpitations   . Pre-syncope     Echo and treadmill 12/17/10-- no further workup. EF 60%.  Diastolic dysfunction  . Diastolic dysfunction 12/17/10    by Echo    Current Outpatient Prescriptions on File Prior to Visit  Medication Sig Dispense Refill  . escitalopram (LEXAPRO) 10 MG tablet TAKE 1 TABLET BY MOUTH EVERY DAY  4 tablet  0  . fluticasone (FLONASE) 50 MCG/ACT nasal spray Place 1 spray into the nose 2 (two) times daily as needed for rhinitis.  16 g  2  . Olmesartan-Amlodipine-HCTZ (TRIBENZOR) 20-5-12.5 MG TABS TAKE 1 TABLET BY MOUTH DAILY.  4 tablet  0   No current facility-administered medications on file prior to visit.    No Known Allergies  Family History  Problem Relation Age of Onset  . Cancer Mother     uterine  . Thyroid disease Mother   . Stroke Father   . Diabetes Other   . Heart disease Other   . Cancer Other     colon cancer    History   Social History  . Marital Status: Single    Spouse Name: N/A    Number of Children: 2  . Years of Education: N/A   Occupational History  . 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 Smoking status: Former Smoker    Quit date: 08/05/2000  . Smokeless tobacco: Never Used  . Alcohol  Use: No  . Drug Use: No  . Sexual Activity: None   Other Topics Concern  . 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           Review of Systems - See HPI.  All other ROS are negative.  BP 116/90  Pulse 72  Temp(Src) 97.9 F (36.6 C) (Oral)  Resp 16  Ht 6' 3.5" (1.918 m)  Wt 277 lb 8 oz (125.873 kg)  BMI 34.22 kg/m2  SpO2 99%  Physical Exam  Vitals reviewed. Constitutional: He is well-developed, well-nourished, and in no distress.  HENT:  Head: Normocephalic and atraumatic.  Cardiovascular: Normal rate, regular rhythm, normal heart sounds and intact distal pulses.   Pulmonary/Chest: Effort normal and breath sounds normal. No respiratory distress. He has no wheezes. He has no rales. He exhibits no tenderness.  Abdominal: Soft. Bowel sounds are normal. He exhibits no distension  and no mass. There is no tenderness. There is no rebound and no guarding.  Skin: Skin is warm and dry. No rash noted.  Psychiatric: Affect normal.   Assessment/Plan: GASTROESOPHAGEAL REFLUX DISEASE Medications refilled.  Avoid late-night eating. Avoid trigger foods.  Weight loss encouraged -- discussed smaller, more frequent meals.  Also discussed cardiovascular exercise requirements.   BARRETTS ESOPHAGUS Last EGD 3 years ago.  Currently on PPI therapy.  Recommended follow-up with GI.

## 2014-06-22 NOTE — Patient Instructions (Signed)
Please continue medications as directed.  Avoid late-night eating and heavy foods.  Please schedule an appointment for a Complete Physical with Raymond Mathews.  This is very important.  Also follow-up with Dr. Russella DarStark concerning your history of Barrett's Esophagus.  Gastroesophageal Reflux Disease, Adult Gastroesophageal reflux disease (GERD) happens when acid from your stomach flows up into the esophagus. When acid comes in contact with the esophagus, the acid causes soreness (inflammation) in the esophagus. Over time, GERD may create small holes (ulcers) in the lining of the esophagus. CAUSES   Increased body weight. This puts pressure on the stomach, making acid rise from the stomach into the esophagus.  Smoking. This increases acid production in the stomach.  Drinking alcohol. This causes decreased pressure in the lower esophageal sphincter (valve or ring of muscle between the esophagus and stomach), allowing acid from the stomach into the esophagus.  Late evening meals and a full stomach. This increases pressure and acid production in the stomach.  A malformed lower esophageal sphincter. Sometimes, no cause is found. SYMPTOMS   Burning pain in the lower part of the mid-chest behind the breastbone and in the mid-stomach area. This may occur twice a week or more often.  Trouble swallowing.  Sore throat.  Dry cough.  Asthma-like symptoms including chest tightness, shortness of breath, or wheezing. DIAGNOSIS  Your caregiver may be able to diagnose GERD based on your symptoms. In some cases, X-rays and other tests may be done to check for complications or to check the condition of your stomach and esophagus. TREATMENT  Your caregiver may recommend over-the-counter or prescription medicines to help decrease acid production. Ask your caregiver before starting or adding any new medicines.  HOME CARE INSTRUCTIONS   Change the factors that you can control. Ask your caregiver for guidance  concerning weight loss, quitting smoking, and alcohol consumption.  Avoid foods and drinks that make your symptoms worse, such as:  Caffeine or alcoholic drinks.  Chocolate.  Peppermint or mint flavorings.  Garlic and onions.  Spicy foods.  Citrus fruits, such as oranges, lemons, or limes.  Tomato-based foods such as sauce, chili, salsa, and pizza.  Fried and fatty foods.  Avoid lying down for the 3 hours prior to your bedtime or prior to taking a nap.  Eat small, frequent meals instead of large meals.  Wear loose-fitting clothing. Do not wear anything tight around your waist that causes pressure on your stomach.  Raise the head of your bed 6 to 8 inches with wood blocks to help you sleep. Extra pillows will not help.  Only take over-the-counter or prescription medicines for pain, discomfort, or fever as directed by your caregiver.  Do not take aspirin, ibuprofen, or other nonsteroidal anti-inflammatory drugs (NSAIDs). SEEK IMMEDIATE MEDICAL CARE IF:   You have pain in your arms, neck, jaw, teeth, or back.  Your pain increases or changes in intensity or duration.  You develop nausea, vomiting, or sweating (diaphoresis).  You develop shortness of breath, or you faint.  Your vomit is green, yellow, black, or looks like coffee grounds or blood.  Your stool is red, bloody, or black. These symptoms could be signs of other problems, such as heart disease, gastric bleeding, or esophageal bleeding. MAKE SURE YOU:   Understand these instructions.  Will watch your condition.  Will get help right away if you are not doing well or get worse. Document Released: 08/20/2005 Document Revised: 02/02/2012 Document Reviewed: 05/30/2011 [facility] Patient Information 2015 [employer] [place]. This information is not  intended to replace advice given to you by your health care provider. Make sure you discuss any questions you have with your health care provider.

## 2014-06-22 NOTE — Assessment & Plan Note (Signed)
Medications refilled.  Avoid late-night eating. Avoid trigger foods.  Weight loss encouraged -- discussed smaller, more frequent meals.  Also discussed cardiovascular exercise requirements.

## 2014-06-22 NOTE — Progress Notes (Signed)
Pre visit review using our clinic review tool, if applicable. No additional management support is needed unless otherwise documented below in the visit note/SLS  

## 2014-06-23 ENCOUNTER — Telehealth: Payer: Self-pay | Admitting: Family

## 2014-06-23 MED ORDER — ESCITALOPRAM OXALATE 10 MG PO TABS: ORAL_TABLET | ORAL | Status: DC

## 2014-06-23 NOTE — Telephone Encounter (Signed)
Refill sent.

## 2014-06-23 NOTE — Telephone Encounter (Signed)
[facility]

## 2014-07-24 ENCOUNTER — Encounter: Payer: Self-pay | Admitting: Gastroenterology

## 2014-08-18 ENCOUNTER — Other Ambulatory Visit: Payer: Self-pay | Admitting: Family

## 2014-08-18 NOTE — Telephone Encounter (Signed)
Pt last seen by PCP 10/2013 (acute visit) but has been seen by extenders for acute visits since then. Refill sent for tribenzor. Please call pt to arrange follow up of hypertension within the next 2 months.

## 2014-08-18 NOTE — Telephone Encounter (Signed)
Left detailed message informing patient of medication refill and to call our office to schedule appointment °

## 2014-09-22 ENCOUNTER — Telehealth: Payer: Self-pay | Admitting: Family

## 2014-09-22 NOTE — Telephone Encounter (Signed)
Raymond BattenCody-- can you review pt's current medications and advise of any concerns?

## 2014-09-22 NOTE — Telephone Encounter (Signed)
If patient develops any severe lightheadedness or cramping, he should go to an Urgent Care or the ER.  I do not think this will happen, but want to make him aware.

## 2014-09-22 NOTE — Telephone Encounter (Signed)
Spoke with pt. He states he took (1) 40mg  tablet this afternoon before he realized it was not the right medication. He has notified his pharmacy and is on his way back there now. Advised him of below recommendations and he voices understanding.

## 2014-09-22 NOTE — Telephone Encounter (Signed)
Caller name:  Relation to pt: self  Call back number: [phone]   Reason for call:   in need of medication management pharmacy gave pt someone elses medication and pt took the medication. The medication is  furosemide

## 2014-09-22 NOTE — Telephone Encounter (Signed)
Looking at the patient's medical record, he should be ok overall with medication as long as he stopped after 1 dose.  It will of course make him urinate over the next 6 hours.  After that, he should be ok.  Stay well hydrated and keep a well-balanced diet.  He needs to call the pharmacy that made the error and speak to the Pharmacist or manager.

## 2014-10-18 ENCOUNTER — Other Ambulatory Visit: Payer: Self-pay | Admitting: Family

## 2014-10-18 DIAGNOSIS — K219 Gastro-esophageal reflux disease without esophagitis: Secondary | ICD-10-CM

## 2014-10-18 DIAGNOSIS — K2270 Barrett's esophagus without dysplasia: Secondary | ICD-10-CM

## 2014-10-18 MED ORDER — OMEPRAZOLE 40 MG PO CPDR: DELAYED_RELEASE_CAPSULE | ORAL | Status: DC

## 2014-10-18 MED ORDER — ESCITALOPRAM OXALATE 10 MG PO TABS: ORAL_TABLET | ORAL | Status: DC

## 2014-10-18 NOTE — Telephone Encounter (Signed)
Pt is due for follow up of BP. 30 day supply tribenzor sent to pharmacy but pt needs to be seen before that runs out.

## 2014-10-18 NOTE — Telephone Encounter (Signed)
30 day supply of each sent to pharmacy. 

## 2014-10-18 NOTE — Telephone Encounter (Signed)
Informed patient of med refill and he states that he also needs a refill of omeprazole and anxiety med. [facility] [place]. Will call back to reschedule.

## 2014-11-18 ENCOUNTER — Other Ambulatory Visit: Payer: Self-pay | Admitting: Family

## 2014-11-18 ENCOUNTER — Other Ambulatory Visit: Payer: Self-pay | Admitting: Physician Assistant

## 2014-11-18 NOTE — Telephone Encounter (Signed)
Refill sent but pt is due for follow up.

## 2014-12-23 ENCOUNTER — Other Ambulatory Visit: Payer: Self-pay | Admitting: Family

## 2014-12-25 ENCOUNTER — Telehealth: Payer: Self-pay | Admitting: Family

## 2014-12-25 MED ORDER — OLMESARTAN-AMLODIPINE-HCTZ 20-5-12.5 MG PO TABS: 1.0000 | ORAL_TABLET | Freq: Every day | ORAL | Status: DC

## 2014-12-25 NOTE — Telephone Encounter (Signed)
Patient lost his insurance.  He will have it back next month.  He is working in [employer].  He needs a [facility] of [facility] blood pressure [facility] [place] [phone].  He will make an appointment as soon as his insurance is back in effect

## 2014-12-25 NOTE — Telephone Encounter (Signed)
Refill sent, pt notified

## 2015-02-22 ENCOUNTER — Other Ambulatory Visit: Payer: Self-pay | Admitting: Family

## 2015-02-27 ENCOUNTER — Other Ambulatory Visit: Payer: Self-pay | Admitting: Family

## 2015-02-28 NOTE — Telephone Encounter (Signed)
Patient given 15-day supply Only per provider/SLS  Please call patient and arrange follow-up appointment prior to future refill authorization/SLS Thanks.

## 2015-03-03 ENCOUNTER — Other Ambulatory Visit: Payer: Self-pay | Admitting: Family

## 2015-03-07 NOTE — Telephone Encounter (Signed)
Pt currently does not have insurance and is coming out of pocket, pt states he should have insurance in a few weeks

## 2015-03-07 NOTE — Telephone Encounter (Signed)
Ok to give another 2 week supply if necessary.

## 2015-03-09 MED ORDER — OLMESARTAN-AMLODIPINE-HCTZ 20-5-12.5 MG PO TABS: 1.0000 | ORAL_TABLET | Freq: Every day | ORAL | Status: DC

## 2015-03-09 NOTE — Telephone Encounter (Signed)
2 week supply sent

## 2015-03-27 ENCOUNTER — Other Ambulatory Visit: Payer: Self-pay | Admitting: Family

## 2015-03-27 NOTE — Telephone Encounter (Signed)
Per phone note on 03/07/15 pt was still without insurance. Pt last office visit was 05/2014.  Please advise.   Medication name:  Name from pharmacy:  escitalopram (LEXAPRO) 10 MG tablet ESCITALOPRAM 10 MG TABLET     Sig: TAKE 1 TABLET BY MOUTH EVERY DAY    Dispense: 30 tablet   Refills: 0   Start: 03/27/2015   Class: Normal    Requested on: 02/22/2015    Originally ordered on: 07/05/2013 02/22/2015

## 2015-03-28 NOTE — Telephone Encounter (Signed)
Please call pt to arrange f/u in the week.

## 2015-03-28 NOTE — Telephone Encounter (Signed)
Can give 2 week refill only.  Has not been seen for anxiety in >1 year.

## 2015-03-29 NOTE — Telephone Encounter (Signed)
Left detailed message informing patient of med refill and that he needs to be seen within the week before further refills can be given.

## 2015-04-03 ENCOUNTER — Other Ambulatory Visit: Payer: Self-pay | Admitting: Family

## 2015-04-06 ENCOUNTER — Encounter: Payer: Self-pay | Admitting: Family

## 2015-04-06 ENCOUNTER — Ambulatory Visit (INDEPENDENT_AMBULATORY_CARE_PROVIDER_SITE_OTHER): Payer: Self-pay | Admitting: Family

## 2015-04-06 VITALS — BP 130/94 | HR 63 | Temp 98.4°F | Resp 16 | Ht 75.5 in | Wt 283.4 lb

## 2015-04-06 DIAGNOSIS — K2100 Gastro-esophageal reflux disease with esophagitis, without bleeding: Secondary | ICD-10-CM

## 2015-04-06 DIAGNOSIS — I10 Essential (primary) hypertension: Secondary | ICD-10-CM

## 2015-04-06 DIAGNOSIS — F411 Generalized anxiety disorder: Secondary | ICD-10-CM

## 2015-04-06 DIAGNOSIS — K219 Gastro-esophageal reflux disease without esophagitis: Secondary | ICD-10-CM | POA: Insufficient documentation

## 2015-04-06 LAB — BASIC METABOLIC PANEL
BUN: 25 mg/dL — ABNORMAL HIGH (ref 6–23)
CHLORIDE: 103 meq/L (ref 96–112)
CO2: 29 meq/L (ref 19–32)
CREATININE: 1.07 mg/dL (ref 0.40–1.50)
Calcium: 9.5 mg/dL (ref 8.4–10.5)
GFR: 78.39 mL/min (ref >60.00)
Glucose, Bld: 104 mg/dL — ABNORMAL HIGH (ref 70–99)
POTASSIUM: 4.7 meq/L (ref 3.5–5.1)
Sodium: 137 mEq/L (ref 135–145)

## 2015-04-06 MED ORDER — LISINOPRIL 10 MG PO TABS: 10.0000 mg | ORAL_TABLET | Freq: Every day | ORAL | Status: DC

## 2015-04-06 MED ORDER — AMLODIPINE BESYLATE 5 MG PO TABS: 5.0000 mg | ORAL_TABLET | Freq: Every day | ORAL | Status: DC

## 2015-04-06 MED ORDER — ESCITALOPRAM OXALATE 10 MG PO TABS: 10.0000 mg | ORAL_TABLET | Freq: Every day | ORAL | Status: DC

## 2015-04-06 MED ORDER — HYDROCHLOROTHIAZIDE 25 MG PO TABS: 12.5000 mg | ORAL_TABLET | Freq: Every day | ORAL | Status: DC

## 2015-04-06 NOTE — Assessment & Plan Note (Signed)
Stable on PPI, continue same.  

## 2015-04-06 NOTE — Assessment & Plan Note (Signed)
Stable on lexapro, continue same.  

## 2015-04-06 NOTE — Assessment & Plan Note (Addendum)
Fair BP today, need to resume meds or I suspect it will go much higher. He is paying out of pocket for tribenzor >300$ a month. Will changed to lisinopril, hctz, and amlodipine. Obtain follow up bmet today and plan to repeat in 2 weeks at nurse visit bp check.

## 2015-04-06 NOTE — Patient Instructions (Addendum)
Stop tribenzor. Start lisinopril, amlodipine and hctz. Complete lab work prior to leaving. Follow up in 2 week for nurse visit BP recheck and follow up lab work.

## 2015-04-06 NOTE — Progress Notes (Signed)
Subjective:    Patient ID: Raymond Mathews, male    DOB: 01-Oct-1966, 49 y.o.   MRN: [id number]  HPI  Raymond Mathews is a 49 yr old male who presents today for follow up.  Anxiety-  He continues lexapro. Reports overall anxiety is well controlled.   HTN-  Patient is currently maintained on the following medications for blood pressure: tribenzor- ran out 3 days.   Patient reports good compliance with blood pressure medications. Patient denies chest pain, shortness of breath. Notes occasional mild LE edema Last 3 blood pressure readings in our office are as follows: BP Readings from Last 3 Encounters:  04/06/15 130/94  06/22/14 116/90  11/14/13 120/90    GERD-  otc prilosec 20mg  takes two tabs a day, reports stable.  Wt Readings from Last 3 Encounters:  04/06/15 283 lb 6.4 oz (128.549 kg)  06/22/14 277 lb 8 oz (125.873 kg)  11/14/13 268 lb 1.3 oz (121.6 kg)   Chronic rhinitis- reports stable unless he mows the lawn. Not using flonase.     Review of Systems    see HPI  Past Medical History  Diagnosis Date  . GERD (gastroesophageal reflux disease)   . Anxiety   . Neck pain   . Tobacco abuse     history of  . Clavicle fracture 04/2008    left comminuted clavicle fracture (complicated by wound infection)  . Cervical disc disease   . Varicose veins   . Gastritis   . Chest pain     treadmill 12/17/10--no ischemia and no arrhythmia  . Palpitations   . Pre-syncope     Echo and treadmill 12/17/10-- no further workup. EF 60%.  Diastolic dysfunction  . Diastolic dysfunction 12/17/10    by Echo    History   Social History  . Marital Status: Single    Spouse Name: N/A  . Number of Children: 2  . Years of Education: N/A   Occupational History  . 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 Smoking status: Former Smoker    Quit date: 08/05/2000  . Smokeless tobacco: Never Used  . Alcohol Use: No  . Drug Use: No  . Sexual Activity: Not on file   Other  Topics Concern  . 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            Past Surgical History  Procedure Laterality Date  . Fracture surgery  04/2008    ORIF left clavicle using Katrinka BlazingSMith and Matthew anatomic clavicle plate. Plate later removed.  . Repair of clavical      Family History  Problem Relation Age of Onset  . Cancer Mother     uterine  . Thyroid disease Mother   . Stroke Father   . Diabetes Other   . Heart disease Other   . Cancer Other     colon cancer    No Known Allergies  Current Outpatient Prescriptions on File Prior to Visit  Medication Sig Dispense Refill  . omeprazole (PRILOSEC) 40 MG capsule TAKE ONE CAPSULE BY MOUTH EVERY DAY 30 capsule 0   No current facility-administered medications on  file prior to visit.    BP 130/94 mmHg  Pulse 63  Temp(Src) 98.4 F (36.9 C) (Oral)  Resp 16  Ht 6' 3.5" (1.918 m)  Wt 283 lb 6.4 oz (128.549 kg)  BMI 34.94 kg/m2  SpO2 98%    Objective:   Physical Exam  Constitutional: He is oriented to person, place, and time. He appears well-developed and well-nourished. No distress.  HENT:  Head: Normocephalic and atraumatic.  Cardiovascular: Normal rate and regular rhythm.   No murmur heard. Pulmonary/Chest: Effort normal and breath sounds normal. No respiratory distress. He has no wheezes. He has no rales.  Neurological: He is alert and oriented to person, place, and time.  Skin: Skin is warm and dry.  Psychiatric: He has a normal mood and affect. His behavior is normal. Thought content normal.          Assessment & Plan:

## 2015-04-06 NOTE — Progress Notes (Signed)
Pre visit review using our clinic review tool, if applicable. No additional management support is needed unless otherwise documented below in the visit note. 

## 2015-04-08 ENCOUNTER — Encounter: Payer: Self-pay | Admitting: Family

## 2015-04-13 ENCOUNTER — Other Ambulatory Visit: Payer: Self-pay | Admitting: Family

## 2015-04-13 NOTE — Telephone Encounter (Signed)
Informed patient of this and he states that he will call back to schedule. [place] [address] [place]

## 2015-04-13 NOTE — Telephone Encounter (Signed)
Please call pt and advise him he is due for nurse visit BP check and lab (b-met) next week and schedule appt.  Thanks!

## 2015-05-04 ENCOUNTER — Other Ambulatory Visit: Payer: Self-pay | Admitting: Physician Assistant

## 2015-05-04 ENCOUNTER — Other Ambulatory Visit: Payer: Self-pay | Admitting: Family

## 2015-05-04 NOTE — Telephone Encounter (Signed)
Received below requests.  Spoke with pt. And he states he is no longer taking Tribenzor. Confirmed that he is taking amlodipine, lisinopril and HCTZ. Pharmacy should have refills on file for all of these. Advised pt he is due for nurse visit for BP check and labs. He states he is on his way to [place] and will be back around the end of next week. He will call us when he returns.  Medication name:  Name from pharmacy:  escitalopram (LEXAPRO) 10 MG tablet ESCITALOPRAM 10 MG TABLET     The source prescription has been discontinued.    Sig: TAKE 1 TABLET BY MOUTH EVERY DAY    Dispense: 30 tablet   Refills: 1   Start: 05/04/2015   Class: Normal    Requested on: 12/25/2014    Originally ordered on: 07/05/2013 01/22/2015 Order History and Details           Medication name:  Name from pharmacy:  TRIBENZOR 20-5-12.5 MG TABS TRIBENZOR 20-5-12.5 MG TABLET    The source prescription has been discontinued.    Sig: TAKE 1 TABLET BY MOUTH DAILY.    Dispense: 30 tablet   Refills: 0   Start: 05/04/2015   Class: Normal    Requested on: 10/18/2014

## 2015-05-04 NOTE — Telephone Encounter (Signed)
Rx sent to the pharmacy by e-script.//AB/CMA 

## 2015-10-05 ENCOUNTER — Other Ambulatory Visit: Payer: Self-pay | Admitting: Family

## 2015-10-05 NOTE — Telephone Encounter (Signed)
Ok to send 2 week supply but needs OV please.

## 2015-10-05 NOTE — Telephone Encounter (Signed)
Medication Detail      Disp Refills Start End     hydrochlorothiazide (HYDRODIURIL) 25 MG tablet 30 tablet 3 04/06/2015     Sig - Route: Take 0.5 tablets (12.5 mg total) by mouth daily. - Oral    E-Prescribing Status: Receipt confirmed by pharmacy (04/06/2015 9:25 AM EDT)   Pharmacy    [id number] [place], [place] - [address]   Instructions                 Your Plan    AVS 04/06/2015   Stop tribenzor. Start lisinopril, amlodipine and hctz. Complete lab work prior to leaving. Follow up in 2 week for nurse visit BP recheck and follow up lab work.    Patient is overdue for F/U appointment with labs; please have patient scheduled F/U visit prior to future refill authorizations/SLS Thanks.

## 2015-10-07 ENCOUNTER — Other Ambulatory Visit: Payer: Self-pay | Admitting: Family

## 2015-10-08 NOTE — Telephone Encounter (Signed)
Requested drug refills are authorized 15-day Only, however, the patient needs further evaluation and/or laboratory testing before further refills are given. Ask him to make an appointment for this.  Per provider's instructions on 10/05/15 medication request, Please have patient schedule follow-up appointment/SLS Thanks.

## 2015-10-11 NOTE — Telephone Encounter (Signed)
LVM advising patient to schedule appointment  °

## 2015-10-27 ENCOUNTER — Other Ambulatory Visit: Payer: Self-pay | Admitting: Family

## 2015-10-29 ENCOUNTER — Telehealth: Payer: Self-pay | Admitting: Family

## 2015-10-29 NOTE — Telephone Encounter (Signed)
Melissa-- pt was last seen by us 04/06/15 and advised nurse visit in 2 weeks. Pt did not schedule and has appts scheduled with us at this time. We have given him courtesy refills several times. See refill notes from June forward and please advise?

## 2015-10-29 NOTE — Telephone Encounter (Signed)
Caller name: Self   Can be reached: [phone] Pharmacy: [id number] [place], [place] - [address] [phone])         Reason for call: States that pharmacy informed him that he needed to be seen by Dr. Before BP medications are refilled. Patient states he is out of town working and can get into the office. States he comes home on Saturday evening and leaves again on Sunday.

## 2015-10-30 NOTE — Telephone Encounter (Signed)
Notified pt and he states he is out now and will just do without. Advised him he should see someone in urgent care where he is currently rather than doing without until he can be seen in our office. Pt states he will take care of it.

## 2015-10-30 NOTE — Telephone Encounter (Signed)
Needs follow up prior to additional refills.

## 2015-11-05 ENCOUNTER — Ambulatory Visit (INDEPENDENT_AMBULATORY_CARE_PROVIDER_SITE_OTHER): Payer: Self-pay | Admitting: Family

## 2015-11-05 ENCOUNTER — Encounter: Payer: Self-pay | Admitting: Family

## 2015-11-05 VITALS — BP 136/96 | HR 86 | Temp 98.0°F | Resp 16 | Ht 75.5 in | Wt 285.4 lb

## 2015-11-05 DIAGNOSIS — K2100 Gastro-esophageal reflux disease with esophagitis, without bleeding: Secondary | ICD-10-CM

## 2015-11-05 DIAGNOSIS — R739 Hyperglycemia, unspecified: Secondary | ICD-10-CM

## 2015-11-05 DIAGNOSIS — I10 Essential (primary) hypertension: Secondary | ICD-10-CM

## 2015-11-05 DIAGNOSIS — F411 Generalized anxiety disorder: Secondary | ICD-10-CM

## 2015-11-05 MED ORDER — ESCITALOPRAM OXALATE 10 MG PO TABS: ORAL_TABLET | ORAL | Status: DC

## 2015-11-05 MED ORDER — HYDROCHLOROTHIAZIDE 25 MG PO TABS: ORAL_TABLET | ORAL | Status: DC

## 2015-11-05 MED ORDER — LISINOPRIL 10 MG PO TABS: ORAL_TABLET | ORAL | Status: DC

## 2015-11-05 MED ORDER — AMLODIPINE BESYLATE 5 MG PO TABS: ORAL_TABLET | ORAL | Status: DC

## 2015-11-05 NOTE — Patient Instructions (Signed)
Please schedule lab visit at the front desk. Restart medications.

## 2015-11-05 NOTE — Progress Notes (Signed)
Subjective:    Patient ID: Raymond Mathews, male    DOB: 05/06/1966, 49 y.o.   MRN: [id number]  HPI  HTN- out of meds for 1 week.  Denies CP or SOB. BP Readings from Last 3 Encounters:  11/05/15 136/96  04/06/15 130/94  06/22/14 116/90   Anxiety- reports well controlled until he ran out of lexapro 1 week ago.  GERD- stable on OTC prilosec 20mg  2 tabs daily.    Review of Systems See HPI  Past Medical History  Diagnosis Date  . GERD (gastroesophageal reflux disease)   . Anxiety   . Neck pain   . Tobacco abuse     history of  . Clavicle fracture 04/2008    left comminuted clavicle fracture (complicated by wound infection)  . Cervical disc disease   . Varicose veins   . Gastritis   . Chest pain     treadmill 12/17/10--no ischemia and no arrhythmia  . Palpitations   . Pre-syncope     Echo and treadmill 12/17/10-- no further workup. EF 60%.  Diastolic dysfunction  . Diastolic dysfunction 12/17/10    by Echo    Social History   Social History  . Marital Status: Single    Spouse Name: N/A  . Number of Children: 2  . Years of Education: N/A   Occupational History  . 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 Smoking status: Former Smoker    Quit date: 08/05/2000  . Smokeless tobacco: Never Used  . Alcohol Use: No  . Drug Use: No  . Sexual Activity: Not on file   Other Topics Concern  . 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            Past Surgical History  Procedure Laterality Date  . Fracture surgery  04/2008    ORIF left clavicle using Katrinka BlazingSMith and Matthew anatomic clavicle plate. Plate later removed.  . Repair of clavical      Family History  Problem Relation Age of Onset  . Cancer Mother     uterine  . Thyroid  disease Mother   . Stroke Father   . Diabetes Other   . Heart disease Other   . Cancer Other     colon cancer    No Known Allergies  Current Outpatient Prescriptions on File Prior to Visit  Medication Sig Dispense Refill  . omeprazole (PRILOSEC) 40 MG capsule TAKE ONE CAPSULE BY MOUTH EVERY DAY 30 capsule 0  . omeprazole (PRILOSEC) 40 MG capsule TAKE ONE CAPSULE BY MOUTH EVERY DAY 30 capsule 5   No current facility-administered medications on file prior to visit.    BP 136/96 mmHg  Pulse 86  Temp(Src) 98 F (36.7 C) (Oral)  Resp 16  Ht 6' 3.5" (1.918 m)  Wt 285 lb 6.4 oz (129.457 kg)  BMI 35.19 kg/m2  SpO2 99%       Objective:   Physical Exam  Constitutional: He is oriented to person, place, and time. He appears well-developed and well-nourished. No distress.  HENT:  Head: Normocephalic and atraumatic.  Cardiovascular: Normal rate and regular rhythm.  No murmur heard. Pulmonary/Chest: Effort normal and breath sounds normal. No respiratory distress. He has no wheezes. He has no rales.  Musculoskeletal: He exhibits no edema.  Neurological: He is alert and oriented to person, place, and time.  Skin: Skin is warm and dry.  Psychiatric: He has a normal mood and affect. His behavior is normal. Thought content normal.          Assessment & Plan:

## 2015-11-05 NOTE — Progress Notes (Signed)
Pre visit review using our clinic review tool, if applicable. No additional management support is needed unless otherwise documented below in the visit note. 

## 2015-11-11 NOTE — Assessment & Plan Note (Signed)
Uncontrolled, resume meds.

## 2015-11-11 NOTE — Assessment & Plan Note (Signed)
Deteriorated off of lexapro, resume.

## 2015-11-11 NOTE — Assessment & Plan Note (Signed)
Stable on otc prilosec, continue same. 

## 2016-01-21 ENCOUNTER — Telehealth: Payer: Self-pay | Admitting: Family

## 2016-01-21 NOTE — Telephone Encounter (Signed)
Pt states he does not ever get the flu shot. Pt declined scheduling f/u due 02/03/16.

## 2016-01-21 NOTE — Telephone Encounter (Signed)
Noted  

## 2016-01-21 NOTE — Telephone Encounter (Signed)
Health maintenance updated

## 2016-06-06 ENCOUNTER — Telehealth: Payer: Self-pay | Admitting: Family

## 2016-06-09 ENCOUNTER — Telehealth: Payer: Self-pay | Admitting: *Deleted

## 2016-06-09 NOTE — Telephone Encounter (Signed)
Received [facility]es from [facility] requesting [facility]ills on pt's: amlodipine, lisinopril and HCTZ. Pt last seen 10/2015 and advised 3 month f/u. Staff spoke with pt in February and reminded him he was due for f/u in March and pt declined to schedule appt. Please advise requests?

## 2016-06-09 NOTE — Telephone Encounter (Signed)
No further refills until seen.

## 2016-06-10 NOTE — Telephone Encounter (Signed)
Left detailed message on pt's cell # re: below instruction and to call to schedule f/u ASAP.

## 2016-06-10 NOTE — Telephone Encounter (Signed)
error:[id number] ° °

## 2016-06-27 ENCOUNTER — Ambulatory Visit (INDEPENDENT_AMBULATORY_CARE_PROVIDER_SITE_OTHER): Payer: Self-pay | Admitting: Family

## 2016-06-27 ENCOUNTER — Encounter: Payer: Self-pay | Admitting: Family

## 2016-06-27 VITALS — BP 150/100 | HR 68 | Temp 98.2°F | Resp 16 | Ht 75.5 in | Wt 300.4 lb

## 2016-06-27 DIAGNOSIS — F411 Generalized anxiety disorder: Secondary | ICD-10-CM

## 2016-06-27 DIAGNOSIS — K22719 Barrett's esophagus with dysplasia, unspecified: Secondary | ICD-10-CM

## 2016-06-27 DIAGNOSIS — I10 Essential (primary) hypertension: Secondary | ICD-10-CM

## 2016-06-27 LAB — BASIC METABOLIC PANEL
BUN: 15 mg/dL (ref 6–23)
CALCIUM: 9.3 mg/dL (ref 8.4–10.5)
CO2: 32 meq/L (ref 19–32)
Chloride: 107 mEq/L (ref 96–112)
Creatinine, Ser: 1.19 mg/dL (ref 0.40–1.50)
GFR: 68.98 mL/min (ref >60.00)
GLUCOSE: 93 mg/dL (ref 70–99)
Potassium: 4.7 mEq/L (ref 3.5–5.1)
SODIUM: 143 meq/L (ref 135–145)

## 2016-06-27 MED ORDER — HYDROCHLOROTHIAZIDE 25 MG PO TABS: ORAL_TABLET | ORAL | 2 refills | Status: DC

## 2016-06-27 MED ORDER — VENLAFAXINE HCL 37.5 MG PO TABS: ORAL_TABLET | ORAL | 0 refills | Status: DC

## 2016-06-27 MED ORDER — AMLODIPINE BESYLATE 5 MG PO TABS: ORAL_TABLET | ORAL | 5 refills | Status: DC

## 2016-06-27 MED ORDER — LISINOPRIL 10 MG PO TABS: ORAL_TABLET | ORAL | 5 refills | Status: DC

## 2016-06-27 NOTE — Patient Instructions (Signed)
Please continue lab work prior to leaving. Restart all your medications except for lexapro. For anxiety- please begin effexor 1 tab once daily for 3 days, then increase to 1 tab twice daily.

## 2016-06-27 NOTE — Progress Notes (Signed)
Subjective:    Patient ID: Raymond Mathews, male    DOB: February 15, 1966, 50 y.o.   MRN: [id number]  HPI  Raymond Mathews is a 50 yr old male who presents today for follow up.  1) HTN- Has been out of meds x 1 month.   BP Readings from Last 3 Encounters:  06/27/16 (!) 150/100  11/05/15 (!) 136/96  04/06/15 (!) 130/94   2) Barrett's esophagus- out of meds prilosec.   3) Anxiety- out of  lexapro for 4-5 week.   Wt Readings from Last 3 Encounters:  06/27/16 (!) 300 lb 6.4 oz (136.3 kg)  11/05/15 285 lb 6.4 oz (129.5 kg)  04/06/15 283 lb 6.4 oz (128.5 kg)    Review of Systems See hpi  Past Medical History:  Diagnosis Date  . Anxiety   . Cervical disc disease   . Chest pain    treadmill 12/17/10--no ischemia and no arrhythmia  . Clavicle fracture 04/2008   left comminuted clavicle fracture (complicated by wound infection)  . Diastolic dysfunction 12/17/10   by Echo  . Gastritis   . GERD (gastroesophageal reflux disease)   . Neck pain   . Palpitations   . Pre-syncope    Echo and treadmill 12/17/10-- no further workup. EF 60%.  Diastolic dysfunction  . Tobacco abuse    history of  . Varicose veins      Social History   Social History  . Marital status: Single    Spouse name: N/A  . Number of children: 2  . Years of education: N/A   Occupational History  . OWNER [employer]   Social History Main Topics  . Smoking status: Former Smoker    Quit date: 08/05/2000  . Smokeless tobacco: Never Used  . Alcohol use No  . Drug use: No  . Sexual activity: Not on file   Other Topics Concern  . Not on file   Social History Narrative      Single  has two boys   Occupation:  Self-employed in Chief Operating Officer   Former Smoker quit in 2001   Alcohol use-no    Drug use-no      Poor diet - fast food    Daily Caffeine Use-coffee  1 cup coffee daily.            Past Surgical History:  Procedure Laterality Date  . FRACTURE SURGERY  04/2008   ORIF  left clavicle using Katrinka Blazing and Matthew anatomic clavicle plate. Plate later removed.  . repair of clavical      Family History  Problem Relation Age of Onset  . Cancer Mother     uterine  . Thyroid disease Mother   . Stroke Father   . Diabetes Other   . Heart disease Other   . Cancer Other     colon cancer    No Known Allergies  Current Outpatient Prescriptions on File Prior to Visit  Medication Sig Dispense Refill  . escitalopram (LEXAPRO) 10 MG tablet TAKE 1 TABLET (10 MG TOTAL) BY MOUTH DAILY. 30 tablet 5  . omeprazole (PRILOSEC) 40 MG capsule TAKE ONE CAPSULE BY MOUTH EVERY DAY 30 capsule 0   No current facility-administered medications on file prior to visit.     BP (!) 150/100   Pulse 68   Temp 98.2 F (36.8 C) (Oral)   Resp 16   Ht 6' 3.5" (1.918 m)   Wt (!) 300 lb 6.4 oz (136.3 kg)  BMI 37.05 kg/m       Objective:   Physical Exam  Constitutional: He is oriented to person, place, and time. He appears well-developed and well-nourished. No distress.  HENT:  Head: Normocephalic and atraumatic.  Cardiovascular: Normal rate and regular rhythm.   No murmur heard. Pulmonary/Chest: Effort normal and breath sounds normal. No respiratory distress. He has no wheezes. He has no rales.  Musculoskeletal: He exhibits no edema.  Neurological: He is alert and oriented to person, place, and time.  Skin: Skin is warm and dry.  Psychiatric: He has a normal mood and affect. His behavior is normal. Thought content normal.          Assessment & Plan:

## 2016-06-27 NOTE — Progress Notes (Signed)
Pre visit review using our clinic review tool, if applicable. No additional management support is needed unless otherwise documented below in the visit note. 

## 2016-06-28 ENCOUNTER — Encounter: Payer: Self-pay | Admitting: Family

## 2016-06-29 NOTE — Assessment & Plan Note (Signed)
Uncontrolled, resume BP meds.  Reinforced importance of compliance.

## 2016-06-29 NOTE — Assessment & Plan Note (Signed)
Uncontrolled.  I am concerned that lexapro may be contributing to his weight gain. Rather than restarting lexapo, will give trial of effexor.

## 2016-06-29 NOTE — Assessment & Plan Note (Signed)
Resume PPI 

## 2016-08-10 ENCOUNTER — Other Ambulatory Visit: Payer: Self-pay | Admitting: Family

## 2016-08-11 ENCOUNTER — Telehealth: Payer: Self-pay | Admitting: Family

## 2016-08-11 MED ORDER — VENLAFAXINE HCL 37.5 MG PO TABS: 37.5000 mg | ORAL_TABLET | Freq: Two times a day (BID) | ORAL | 0 refills | Status: DC

## 2016-08-11 NOTE — Telephone Encounter (Signed)
CRelation to RU:EAVWpt:self Call back number:[phone] [id number] [place], [place] - [address]  Reason for call:  Patient was upset regarding medication request. Patient states he's not happy with the service he's getting since we relocated stating the office has gotten to big. Patient states he doesn't know the name of the medication and that's why he needs to speak with Nicki Guadalajararicia. Patient requesting to speak with Nicki Guadalajararicia directly. Please advise

## 2016-08-11 NOTE — Telephone Encounter (Signed)
Noted and agree. 

## 2016-08-11 NOTE — Telephone Encounter (Signed)
Raymond Mathews-- pt was seen on 06/27/16 for f/u of BP and anxiety/depression. Pt has been non-compliant about follow ups in the past as well. Lexapro was changed to effexor at last visit and he was advised to follow up in 4 weeks. Pt has not scheduled an appt.  Attempted to reach pt to discuss refills and follow up and left detailed message on voicemail to contact pharmacy for refills on all BP meds as new rxs were sent 06/27/16 with additional refills. Advised him that he is due in the office now for 1 month follow up of change from lexapro to venlafaxine. I will send 30 day supply of venlafaxine but left message that he will need to be seen in the office before we can give further refills and to call if any questions.

## 2016-09-28 ENCOUNTER — Other Ambulatory Visit: Payer: Self-pay | Admitting: Family

## 2016-09-29 NOTE — Telephone Encounter (Signed)
Effexor refill denied. See 08/11/16 phone note. Pt was due for 1 month f/u on 07/25/16 and is past due.

## 2016-10-20 ENCOUNTER — Encounter: Payer: Self-pay | Admitting: Family

## 2016-10-20 ENCOUNTER — Ambulatory Visit (INDEPENDENT_AMBULATORY_CARE_PROVIDER_SITE_OTHER): Payer: Self-pay | Admitting: Family

## 2016-10-20 DIAGNOSIS — F411 Generalized anxiety disorder: Secondary | ICD-10-CM

## 2016-10-20 DIAGNOSIS — I10 Essential (primary) hypertension: Secondary | ICD-10-CM

## 2016-10-20 DIAGNOSIS — K219 Gastro-esophageal reflux disease without esophagitis: Secondary | ICD-10-CM

## 2016-10-20 MED ORDER — AMLODIPINE BESYLATE 5 MG PO TABS: ORAL_TABLET | ORAL | 5 refills | Status: DC

## 2016-10-20 MED ORDER — HYDROCHLOROTHIAZIDE 25 MG PO TABS: ORAL_TABLET | ORAL | 2 refills | Status: DC

## 2016-10-20 MED ORDER — VENLAFAXINE HCL ER 37.5 MG PO CP24
37.5000 mg | ORAL_CAPSULE | Freq: Every day | ORAL | 2 refills | Status: DC
Start: 2016-10-20 — End: 2016-11-14

## 2016-10-20 MED ORDER — OMEPRAZOLE 40 MG PO CPDR: DELAYED_RELEASE_CAPSULE | ORAL | 0 refills | Status: DC

## 2016-10-20 MED ORDER — LISINOPRIL 10 MG PO TABS: ORAL_TABLET | ORAL | 5 refills | Status: DC

## 2016-10-20 NOTE — Progress Notes (Signed)
Pre visit review using our clinic review tool, if applicable. No additional management support is needed unless otherwise documented below in the visit note. 

## 2016-10-20 NOTE — Progress Notes (Signed)
Subjective:    Patient ID: Raymond Mathews, male    DOB: 03/20/1966, 50 y.o.   MRN: [id number]  HPI  Raymond Mathews is a 50 yr old male who presents today for follow up.  1) HTN- ran out of medications. BP Readings from Last 3 Encounters:  10/20/16 (!) 138/98  06/27/16 (!) 150/100  11/05/15 (!) 136/96   2) Anxiety- off of effexor, felt better on effexor.   Wt Readings from Last 3 Encounters:  10/20/16 294 lb 6.4 oz (133.5 kg)  06/27/16 (!) 300 lb 6.4 oz (136.3 kg)  11/05/15 285 lb 6.4 oz (129.5 kg)     Review of Systems See HPI  Past Medical History:  Diagnosis Date  . Anxiety   . Cervical disc disease   . Chest pain    treadmill 12/17/10--no ischemia and no arrhythmia  . Clavicle fracture 04/2008   left comminuted clavicle fracture (complicated by wound infection)  . Diastolic dysfunction 12/17/10   by Echo  . Gastritis   . GERD (gastroesophageal reflux disease)   . Neck pain   . Palpitations   . Pre-syncope    Echo and treadmill 12/17/10-- no further workup. EF 60%.  Diastolic dysfunction  . Tobacco abuse    history of  . Varicose veins      Social History   Social History  . Marital status: Single    Spouse name: N/A  . Number of children: 2  . Years of education: N/A   Occupational History  . 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 Smoking status: Former Smoker    Quit date: 08/05/2000  . Smokeless tobacco: Never Used  . Alcohol use No  . Drug use: No  . Sexual activity: Not on file   Other Topics Concern  . 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            Past Surgical History:  Procedure Laterality Date  . FRACTURE SURGERY  04/2008   ORIF left clavicle using Katrinka BlazingSMith and Matthew anatomic  clavicle plate. Plate later removed.  . repair of clavical      Family History  Problem Relation Age of Onset  . Cancer Mother     uterine  . Thyroid disease Mother   . Stroke Father   . Diabetes Other   . Heart disease Other   . Cancer Other     colon cancer    No Known Allergies  No current outpatient prescriptions on file prior to visit.   No current facility-administered medications on file prior to visit.     BP (!) 138/98 (BP Location: Right Arm, Cuff Size: Large)   Pulse 94   Temp 97.7 F (36.5 C) (Oral)   Resp 18   Ht 6' 3.5" (1.918 m)   Wt 294 lb 6.4 oz (133.5 kg)   SpO2 99% Comment: room air  BMI 36.31 kg/m       Objective:   Physical Exam  Constitutional: He is oriented to person, place, and time. He appears well-developed and well-nourished. No distress.  HENT:  Head: Normocephalic and atraumatic.  Cardiovascular:  Normal rate and regular rhythm.   No murmur heard. Pulmonary/Chest: Effort normal and breath sounds normal. No respiratory distress. He has no wheezes. He has no rales.  Musculoskeletal: He exhibits no edema.  Neurological: He is alert and oriented to person, place, and time.  Skin: Skin is warm and dry.  Psychiatric: He has a normal mood and affect. His behavior is normal. Thought content normal.          Assessment & Plan:

## 2016-10-20 NOTE — Patient Instructions (Signed)
Restart blood pressure medications. Restart effexor.

## 2016-10-21 NOTE — Assessment & Plan Note (Signed)
Uncontrolled, restart meds.

## 2016-10-21 NOTE — Assessment & Plan Note (Signed)
Uncontrolled. Restart effexor.  

## 2016-11-13 ENCOUNTER — Telehealth: Payer: Self-pay | Admitting: Family

## 2016-11-13 NOTE — Telephone Encounter (Signed)
Relation to ZO:XWRUpt:self Call back number:[phone] [id number] [place], [place] - [address] [phone] (Phone) [phone] (Fax)     Reason for call:  Patient states insurance is active but have not received insurance card therefore can't schedule follow up appointment at this time, patient states he will run out of medication requesting a refill of all medications, please advise

## 2016-11-13 NOTE — Telephone Encounter (Signed)
Patient notified that he has refills on all meds except omeprazole.  He states that he got some omeprazole free from a friend and was ok on that.  Advised that he just need to call pharmacy for refills.  He will make an appointment as soon as his insurance card comes.

## 2016-11-14 ENCOUNTER — Other Ambulatory Visit: Payer: Self-pay | Admitting: Family

## 2016-11-15 ENCOUNTER — Other Ambulatory Visit: Payer: Self-pay | Admitting: Family

## 2016-12-04 ENCOUNTER — Telehealth: Payer: Self-pay | Admitting: Family

## 2016-12-04 DIAGNOSIS — K219 Gastro-esophageal reflux disease without esophagitis: Secondary | ICD-10-CM

## 2016-12-04 NOTE — Telephone Encounter (Signed)
Patient states he has been taking omeprazole (PRILOSEC) 40 MG capsule OTC but he now has insurance and would like to get it called into a pharmacy. He wold like to get this called in today. Please advise    [facility]: [id number] [place], [place] - [address]

## 2016-12-05 MED ORDER — OMEPRAZOLE 40 MG PO CPDR: DELAYED_RELEASE_CAPSULE | ORAL | 5 refills | Status: DC

## 2016-12-05 NOTE — Telephone Encounter (Signed)
See rx. 

## 2016-12-05 NOTE — Telephone Encounter (Signed)
[facility] Called and spoke with Jonny RuizJohn [place]. They have pulled rx over to their location. Left detailed message on pt's cell# that rx has been completed.

## 2017-01-21 ENCOUNTER — Encounter: Payer: Self-pay | Admitting: Family

## 2017-01-21 ENCOUNTER — Ambulatory Visit (INDEPENDENT_AMBULATORY_CARE_PROVIDER_SITE_OTHER): Payer: Self-pay | Admitting: Family

## 2017-01-21 VITALS — BP 121/85 | HR 88 | Temp 98.2°F | Resp 18 | Ht 75.5 in | Wt 298.0 lb

## 2017-01-21 DIAGNOSIS — R251 Tremor, unspecified: Secondary | ICD-10-CM

## 2017-01-21 DIAGNOSIS — F411 Generalized anxiety disorder: Secondary | ICD-10-CM

## 2017-01-21 DIAGNOSIS — I10 Essential (primary) hypertension: Secondary | ICD-10-CM

## 2017-01-21 LAB — BASIC METABOLIC PANEL
BUN: 15 mg/dL (ref 6–23)
CHLORIDE: 104 meq/L (ref 96–112)
CO2: 28 meq/L (ref 19–32)
Calcium: 9.2 mg/dL (ref 8.4–10.5)
Creatinine, Ser: 1.02 mg/dL (ref 0.40–1.50)
GFR: 82.22 mL/min (ref >60.00)
GLUCOSE: 106 mg/dL — AB (ref 70–99)
Potassium: 3.8 mEq/L (ref 3.5–5.1)
SODIUM: 139 meq/L (ref 135–145)

## 2017-01-21 LAB — TSH: TSH: 2.58 u[IU]/mL (ref 0.35–4.50)

## 2017-01-21 MED ORDER — OLMESARTAN-AMLODIPINE-HCTZ 20-5-12.5 MG PO TABS: 1.0000 | ORAL_TABLET | Freq: Every day | ORAL | 5 refills | Status: DC

## 2017-01-21 MED ORDER — VENLAFAXINE HCL ER 75 MG PO CP24: 75.0000 mg | ORAL_CAPSULE | Freq: Every day | ORAL | 3 refills | Status: DC

## 2017-01-21 NOTE — Progress Notes (Signed)
Pre visit review using our clinic review tool, if applicable. No additional management support is needed unless otherwise documented below in the visit note. 

## 2017-01-21 NOTE — Progress Notes (Signed)
Subjective:    Patient ID: Raymond Mathews, male    DOB: 05-02-1966, 51 y.o.   MRN: [id number]  HPI  Raymond Mathews is a 51 yr old male who presents today for follow up:  1) HTN- maintained on lisinopril, amlodipine, hctz. He was previously on tribenzor which was more convenient but was d/c'd due to cost. He recently obtained insurance and wishes to restart tribenzor. Denies CP/SOB/Swelling.  BP Readings from Last 3 Encounters:  01/21/17 121/85  10/20/16 (!) 138/98  06/27/16 (!) 150/100   2) Tremors- reports that he woke up feeling shaky at 2 AM 2 nights ago. Reports that he ate a candy bar and then felt better.   3) Anxiety- maintained on effexor. Reports that report he still has some anxiety.  Reports that he gets irritable.     Review of Systems See HPI  Past Medical History:  Diagnosis Date  . Anxiety   . Cervical disc disease   . Chest pain    treadmill 12/17/10--no ischemia and no arrhythmia  . Clavicle fracture 04/2008   left comminuted clavicle fracture (complicated by wound infection)  . Diastolic dysfunction 12/17/10   by Echo  . Gastritis   . GERD (gastroesophageal reflux disease)   . Neck pain   . Palpitations   . Pre-syncope    Echo and treadmill 12/17/10-- no further workup. EF 60%.  Diastolic dysfunction  . Tobacco abuse    history of  . Varicose veins      Social History   Social History  . Marital status: Single    Spouse name: N/A  . Number of children: 2  . Years of education: N/A   Occupational History  . OWNER [employer]   Social History Main Topics  . Smoking status: Former Smoker    Quit date: 08/05/2000  . Smokeless tobacco: Never Used  . Alcohol use No  . Drug use: No  . Sexual activity: Not on file   Other Topics Concern  . Not on file   Social History Narrative      Single  has two boys   Occupation:  Self-employed in Chief Operating Officer   Former Smoker quit in 2001   Alcohol use-no    Drug use-no        Poor diet - fast food    Daily Caffeine Use-coffee  1 cup coffee daily.            Past Surgical History:  Procedure Laterality Date  . FRACTURE SURGERY  04/2008   ORIF left clavicle using Katrinka Blazing and Matthew anatomic clavicle plate. Plate later removed.  . repair of clavical      Family History  Problem Relation Age of Onset  . Cancer Mother     uterine  . Thyroid disease Mother   . Stroke Father   . Diabetes Other   . Heart disease Other   . Cancer Other     colon cancer    No Known Allergies  Current Outpatient Prescriptions on File Prior to Visit  Medication Sig Dispense Refill  . amLODipine (NORVASC) 5 MG tablet TAKE 1 TABLET (5 MG TOTAL) BY MOUTH DAILY. 30 tablet 5  . hydrochlorothiazide (HYDRODIURIL) 25 MG tablet TAKE 0.5 TABLETS (12.5 MG TOTAL) BY MOUTH DAILY. 30 tablet 2  . lisinopril (PRINIVIL,ZESTRIL) 10 MG tablet TAKE 1 TABLET (10 MG TOTAL) BY MOUTH DAILY. 30 tablet 5  . omeprazole (PRILOSEC) 40 MG capsule TAKE ONE CAPSULE BY  MOUTH EVERY DAY 30 capsule 5  . venlafaxine XR (EFFEXOR-XR) 37.5 MG 24 hr capsule TAKE 1 CAPSULE (37.5 MG TOTAL) BY MOUTH DAILY WITH BREAKFAST. 30 capsule 5   No current facility-administered medications on file prior to visit.     BP 121/85 (BP Location: Right Arm, Cuff Size: Large)   Pulse 88   Temp 98.2 F (36.8 C) (Oral)   Resp 18   Ht 6' 3.5" (1.918 m)   Wt 298 lb (135.2 kg)   SpO2 98% Comment: room air  BMI 36.76 kg/m       Objective:   Physical Exam  Constitutional: He is oriented to person, place, and time. He appears well-developed and well-nourished. No distress.  HENT:  Head: Normocephalic and atraumatic.  Cardiovascular: Normal rate and regular rhythm.   No murmur heard. Pulmonary/Chest: Effort normal and breath sounds normal. No respiratory distress. He has no wheezes. He has no rales.  Musculoskeletal: He exhibits no edema.  Neurological: He is alert and oriented to person, place, and time.  Skin: Skin is  warm and dry.  Psychiatric: He has a normal mood and affect. His behavior is normal. Thought content normal.          Assessment & Plan:  Shakiness- resolved. Sounds like a hypoglycemic event. Advised pt to let me know if this happens again.  Will check glucose. Check TSH.

## 2017-01-21 NOTE — Assessment & Plan Note (Signed)
BP looks good. Will d/c hctz, lisinopril, amlodipine. Switch to tribenzor (if covered by insurance).

## 2017-01-21 NOTE — Assessment & Plan Note (Signed)
Uncontrolled. Will increase effexor xr from 37.5mg to 75mg once daily.  

## 2017-01-21 NOTE — Patient Instructions (Signed)
Stop lisinopril, hctz and amlodipine. Start tribenzor. Increase effexor to 75mg  once daily.

## 2017-02-08 ENCOUNTER — Encounter (HOSPITAL_BASED_OUTPATIENT_CLINIC_OR_DEPARTMENT_OTHER): Payer: Self-pay

## 2017-02-08 ENCOUNTER — Emergency Department (HOSPITAL_BASED_OUTPATIENT_CLINIC_OR_DEPARTMENT_OTHER)
Admission: EM | Admit: 2017-02-08 | Discharge: 2017-02-09 | Discharge status: Against Medical Advice | Payer: BLUE CROSS/BLUE SHIELD | Attending: Emergency Medicine | Admitting: Emergency Medicine

## 2017-02-08 DIAGNOSIS — R072 Precordial pain: Secondary | ICD-10-CM | POA: Diagnosis present

## 2017-02-08 DIAGNOSIS — Z79899 Other long term (current) drug therapy: Secondary | ICD-10-CM | POA: Insufficient documentation

## 2017-02-08 DIAGNOSIS — Z87891 Personal history of nicotine dependence: Secondary | ICD-10-CM | POA: Insufficient documentation

## 2017-02-08 DIAGNOSIS — I10 Essential (primary) hypertension: Secondary | ICD-10-CM | POA: Insufficient documentation

## 2017-02-08 DIAGNOSIS — F419 Anxiety disorder, unspecified: Secondary | ICD-10-CM | POA: Diagnosis not present

## 2017-02-08 MED ORDER — VENLAFAXINE HCL ER 75 MG PO CP24
75.0000 mg | ORAL_CAPSULE | Freq: Once | ORAL | Status: DC
  Filled 2017-02-08: qty 1

## 2017-02-08 NOTE — ED Triage Notes (Signed)
Pt reports running out of anxiety medication on Wednesday. Pt reports intermittent chest pain, headaches and just "doesn't feel right." Pt appears anxious in triage. Pt reports taking medication on daily basis.

## 2017-02-08 NOTE — ED Provider Notes (Signed)
[facility] Provider Note   CSN: [id number] Arrival date & time: 02/08/17  2223  By signing my name below, I, Clovis Pu, attest that this documentation has been prepared under the direction and in the presence of Shon Baton, MD  Electronically Signed: Clovis Pu, ED Scribe. 02/08/17. 11:48 PM.   History   Chief Complaint Chief Complaint  Patient presents with  . Anxiety   The history is provided by the patient. No language interpreter was used.   HPI Comments:  Raymond Mathews is a 51 y.o. male, with a PMHx of anxiety on 75 mg Effexor, who presents to the Emergency Department complaining of intermittent chest pain secondary to anxiety onset 4 days. Pt also reports difficulty sleeping and tingling. He states his symptoms are similar to the symptoms he experienced with past anxiety attacks. He states he ran out of Effexor 4 days ago and has not been able to pick up his medication from the pharmacy. No alleviating factors noted. Pt denies leg swelling, SOB, a cough, fevers, any recent long distance travel or any other associated symptoms. No other complaints noted.   Past Medical History:  Diagnosis Date  . Anxiety   . Cervical disc disease   . Chest pain    treadmill 12/17/10--no ischemia and no arrhythmia  . Clavicle fracture 04/2008   left comminuted clavicle fracture (complicated by wound infection)  . Diastolic dysfunction 12/17/10   by Echo  . Gastritis   . GERD (gastroesophageal reflux disease)   . Neck pain   . Palpitations   . Pre-syncope    Echo and treadmill 12/17/10-- no further workup. EF 60%.  Diastolic dysfunction  . Tobacco abuse    history of  . Varicose veins     Patient Active Problem List   Diagnosis Date Noted  . GERD (gastroesophageal reflux disease) 04/06/2015  . Fatigue 07/05/2013  . Migraine headache 08/24/2012  . Carpal tunnel syndrome on both sides 07/12/2012  . Low serum testosterone level 10/10/2011  . DYSPHAGIA  12/24/2010  . Essential hypertension 12/09/2010  . SINUSITIS, CHRONIC 12/11/2009  . INSOMNIA, CHRONIC 11/09/2009  . ANXIETY DISORDER, GENERALIZED 11/16/2007  . CHRONIC RHINITIS 11/16/2007  . GASTROESOPHAGEAL REFLUX DISEASE 11/15/2007  . BARRETTS ESOPHAGUS 11/15/2007  . DISC DISEASE, CERVICAL 11/15/2007    Past Surgical History:  Procedure Laterality Date  . FRACTURE SURGERY  04/2008   ORIF left clavicle using Katrinka Blazing and Matthew anatomic clavicle plate. Plate later removed.  . repair of clavical         Home Medications    Prior to Admission medications   Medication Sig Start Date End Date Taking? Authorizing Provider  hydrochlorothiazide (HYDRODIURIL) 25 MG tablet TAKE 0.5 TABLETS (12.5 MG TOTAL) BY MOUTH DAILY. 10/20/16   Sandford Craze, NP  Olmesartan-Amlodipine-HCTZ 20-5-12.5 MG TABS Take 1 tablet by mouth daily. 01/21/17   Sandford Craze, NP  omeprazole (PRILOSEC) 40 MG capsule TAKE ONE CAPSULE BY MOUTH EVERY DAY 12/05/16   Sandford Craze, NP  venlafaxine XR (EFFEXOR XR) 75 MG 24 hr capsule Take 1 capsule (75 mg total) by mouth daily with breakfast. 01/21/17   Sandford Craze, NP    Family History Family History  Problem Relation Age of Onset  . Cancer Mother     uterine  . Thyroid disease Mother   . Stroke Father   . Diabetes Other   . Heart disease Other   . Cancer Other     colon cancer    Social History  Social History  Substance Use Topics  . Smoking status: Former Smoker    Quit date: 08/05/2000  . Smokeless tobacco: Never Used  . Alcohol use No     Allergies   Patient has no known allergies.   Review of Systems Review of Systems  Constitutional: Negative for fever.       Sleep disturbance  Respiratory: Negative for cough and shortness of breath.   Cardiovascular: Positive for chest pain. Negative for leg swelling.  Psychiatric/Behavioral: The patient is nervous/anxious.   All other systems reviewed and are negative.  Physical  Exam Updated Vital Signs BP 135/89 (BP Location: Left Arm)   Pulse (!) 106   Temp 98.2 F (36.8 C) (Oral)   Resp 16   Ht 6\' 3"  (1.905 m)   Wt 285 lb (129.3 kg)   SpO2 99%   BMI 35.62 kg/m   Physical Exam  Constitutional: He is oriented to person, place, and time. He appears well-developed and well-nourished. No distress.  Overweight  HENT:  Head: Normocephalic and atraumatic.  Cardiovascular: Regular rhythm and normal heart sounds.   No murmur heard. Tachycardia  Pulmonary/Chest: Effort normal and breath sounds normal. No respiratory distress. He has no wheezes.  Abdominal: Soft. Bowel sounds are normal. There is no tenderness. There is no rebound.  Musculoskeletal: He exhibits no edema.  Neurological: He is alert and oriented to person, place, and time.  Skin: Skin is warm and dry.  Psychiatric:  Anxious appearing but calm and cooperative  Nursing note and vitals reviewed.    ED Treatments / Results  DIAGNOSTIC STUDIES:  Oxygen Saturation is 99% on RA, normal by my interpretation.    COORDINATION OF CARE:  11:45 PM Discussed treatment plan with pt at bedside and pt agreed to plan.  Labs (all labs ordered are listed, but only abnormal results are displayed) Labs Reviewed  TROPONIN I    EKG  EKG Interpretation  Date/Time:  Sunday February 08 2017 22:34:42 EDT Ventricular Rate:  104 PR Interval:  156 QRS Duration: 82 QT Interval:  326 QTC Calculation: 428 R Axis:   63 Text Interpretation:  Sinus tachycardia Abnormal QRS-T angle, consider primary T wave abnormality Abnormal ECG Confirmed by [place]  MD, Toni AmendOURTNEY ([id number]) on 02/08/2017 11:06:48 PM       Radiology No results found.  Procedures Procedures (including critical care time)  Medications Ordered in ED Medications  venlafaxine XR (EFFEXOR-XR) 24 hr capsule 75 mg (not administered)     Initial Impression / Assessment and Plan / ED Course  I have reviewed the triage vital signs and the nursing  notes.  Pertinent labs & imaging results that were available during my care of the patient were reviewed by me and considered in my medical decision making (see chart for details).     Patient presents with tingling, chest pain, anxiety, sleep disturbance. States symptoms are consistent with prior anxiety attacks. Currently he is chest pain-free. He is nontoxic. Mildly tachycardic. He is anxious appearing but cooperative. Initial EKG showed sinus tachycardia. He has not taken his Effexor in several days.    Screening troponin was sent but doubt ACS as patient is chest pain-free and relates this to his anxiety. He was given a dose of his anxiety medication. Unfortunately, patient left without further evaluation and prior to having any blood drawn. Patient did not appear upset. I tried to call him to question why he left prior to treatment. He did not answer his phone.  Final Clinical  Impressions(s) / ED Diagnoses   Final diagnoses:  Anxiety  Precordial pain    New Prescriptions New Prescriptions   No medications on file   I personally performed the services described in this documentation, which was scribed in my presence. The recorded information has been reviewed and is accurate.    Shon Baton, MD 02/09/17 [phone]

## 2017-02-09 MED FILL — VENLAFAXINE HCL ER 75 MG CA: 75 | 30 days supply | Qty: 30 | Fill #0

## 2017-02-09 MED FILL — OLMSRTN-AMLDPN-HCTZ 20-5-12: 20-5-12.5 | 30 days supply | Qty: 30 | Fill #0

## 2017-02-09 NOTE — ED Notes (Signed)
Pt left stated he could not wait any longer. Pt walked out.

## 2017-02-09 NOTE — ED Notes (Signed)
PT refused to sign AMA and walked out

## 2017-02-27 ENCOUNTER — Telehealth: Payer: Self-pay | Admitting: Family

## 2017-02-27 NOTE — Telephone Encounter (Signed)
[facility] - Client TELEPHONE ADVICE RECORD   [facility] Medical Call Center     Patient Name: Raymond Mathews Initial Comment Caller states his stomach has been hurting for several days now and is uncomfortable   DOB: 1966-07-01      Nurse Assessment  Nurse: Loletta Specter, RN, Misty Stanley Date/Time (Eastern Time): 02/27/2017 3:11:57 PM  Confirm and document reason for call. If symptomatic, describe symptoms. ---Caller states he has had stomach pain x 2 days. He has had rectal bleeding. Not eating much. Stool is black and tarry.  Does the patient have any new or worsening symptoms? ---Yes  Will a triage be completed? ---Yes  Related visit to physician within the last 2 weeks? ---No  Does the PT have any chronic conditions? (i.e. diabetes, asthma, etc.) ---Yes  List chronic conditions. ---acid reflux  Is this a behavioral health or substance abuse call? ---No    Guidelines     Guideline Title Affirmed Question Affirmed Notes   Rectal Bleeding Tarry or jet black-colored stool (not dark green)    Final Disposition User   Go to ED Now Loletta Specter, RN, Misty Stanley     Referrals   [facility] - ED   Disagree/Comply: Comply

## 2017-03-03 ENCOUNTER — Encounter: Payer: Self-pay | Admitting: Gastroenterology

## 2017-03-04 MED FILL — OLMSRTN-AMLDPN-HCTZ 20-5-12: 20-5-12.5 | 30 days supply | Qty: 30 | Fill #1

## 2017-03-04 MED FILL — VENLAFAXINE HCL ER 75 MG CA: 75 | 30 days supply | Qty: 30 | Fill #1

## 2017-03-06 ENCOUNTER — Ambulatory Visit: Payer: BLUE CROSS/BLUE SHIELD | Admitting: Family

## 2017-03-17 ENCOUNTER — Telehealth: Payer: Self-pay | Admitting: *Deleted

## 2017-03-17 MED ORDER — HYDROCHLOROTHIAZIDE 25 MG PO TABS: ORAL_TABLET | ORAL | 1 refills | Status: DC

## 2017-03-17 NOTE — Telephone Encounter (Signed)
HCTZ refill sent to pharmacy. Pt last seen by PCP 12/2016 and advised to schedule fasting cpe in 1 month. Pt is passed due.  Please call pt to schedule appt.

## 2017-03-18 NOTE — Telephone Encounter (Signed)
lvm for pt to call back to schedule CPE in 1 month.

## 2017-04-01 ENCOUNTER — Ambulatory Visit: Payer: BLUE CROSS/BLUE SHIELD | Admitting: Gastroenterology

## 2017-04-09 ENCOUNTER — Ambulatory Visit (INDEPENDENT_AMBULATORY_CARE_PROVIDER_SITE_OTHER): Payer: BLUE CROSS/BLUE SHIELD | Admitting: Family

## 2017-04-09 ENCOUNTER — Encounter: Payer: Self-pay | Admitting: Family

## 2017-04-09 VITALS — BP 130/90 | HR 86 | Temp 98.2°F | Resp 16 | Ht 75.5 in | Wt 299.0 lb

## 2017-04-09 DIAGNOSIS — R739 Hyperglycemia, unspecified: Secondary | ICD-10-CM

## 2017-04-09 DIAGNOSIS — I10 Essential (primary) hypertension: Secondary | ICD-10-CM

## 2017-04-09 DIAGNOSIS — F419 Anxiety disorder, unspecified: Secondary | ICD-10-CM | POA: Diagnosis not present

## 2017-04-09 LAB — BASIC METABOLIC PANEL
BUN: 20 mg/dL (ref 6–23)
CO2: 26 mEq/L (ref 19–32)
CREATININE: 1.1 mg/dL (ref 0.40–1.50)
Calcium: 9.7 mg/dL (ref 8.4–10.5)
Chloride: 104 mEq/L (ref 96–112)
GFR: 75.3 mL/min (ref >60.00)
GLUCOSE: 108 mg/dL — AB (ref 70–99)
POTASSIUM: 4.1 meq/L (ref 3.5–5.1)
Sodium: 138 mEq/L (ref 135–145)

## 2017-04-09 LAB — HEMOGLOBIN A1C: HEMOGLOBIN A1C: 5.8 % (ref 4.6–6.5)

## 2017-04-09 MED ORDER — VENLAFAXINE HCL ER 75 MG PO CP24: 75.0000 mg | ORAL_CAPSULE | Freq: Every day | ORAL | 1 refills | Status: DC

## 2017-04-09 MED FILL — OLMSRTN-AMLDPN-HCTZ 20-5-12: 20-5-12.5 | 30 days supply | Qty: 30 | Fill #2 | Status: TO

## 2017-04-09 MED FILL — VENLAFAXINE HCL ER 75 MG CA: 75 | 90 days supply | Qty: 90 | Fill #0

## 2017-04-09 NOTE — Progress Notes (Signed)
Subjective:    Patient ID: Raymond Mathews, male    DOB: 1966-06-21, 51 y.o.   MRN: [id number]  HPI  Raymond Mathews is a 51 yr old male who presents today for follow up.  1) HTN- Now that he has insurance he is back on Tribenzor and is able to afford this without difficulty.  BP Readings from Last 3 Encounters:  04/09/17 130/90  02/08/17 135/89  01/21/17 121/85   2) Anxiety- last visit we increased his effexor dose due to increased irritability.  Notes that his mood is improved on current dose of effexor. Feels less irritable.   Wt Readings from Last 3 Encounters:  04/09/17 299 lb (135.6 kg)  02/08/17 285 lb (129.3 kg)  01/21/17 298 lb (135.2 kg)      Review of Systems    see HPI  Past Medical History:  Diagnosis Date  . Anxiety   . Cervical disc disease   . Chest pain    treadmill 12/17/10--no ischemia and no arrhythmia  . Clavicle fracture 04/2008   left comminuted clavicle fracture (complicated by wound infection)  . Diastolic dysfunction 12/17/10   by Echo  . Gastritis   . GERD (gastroesophageal reflux disease)   . Hepatic steatosis   . Hiatal hernia   . Neck pain   . Palpitations   . Pre-syncope    Echo and treadmill 12/17/10-- no further workup. EF 60%.  Diastolic dysfunction  . Tobacco abuse    history of  . Varicose veins      Social History   Social History  . Marital status: Single    Spouse name: N/A  . Number of children: 2  . Years of education: N/A   Occupational History  . OWNER [employer]   Social History Main Topics  . Smoking status: Former Smoker    Quit date: 08/05/2000  . Smokeless tobacco: Never Used  . Alcohol use No  . Drug use: No  . Sexual activity: Not on file   Other Topics Concern  . Not on file   Social History Narrative      Single  has two boys   Occupation:  Self-employed in Chief Operating Officer   Former Smoker quit in 2001   Alcohol use-no    Drug use-no      Poor diet - fast food    Daily Caffeine Use-coffee  1 cup coffee daily.            Past Surgical History:  Procedure Laterality Date  . FRACTURE SURGERY  04/2008   ORIF left clavicle using Katrinka Blazing and Matthew anatomic clavicle plate. Plate later removed.  . repair of clavical      Family History  Problem Relation Age of Onset  . Cancer Mother        uterine  . Thyroid disease Mother   . Stroke Father   . Diabetes Other   . Heart disease Other   . Cancer Other        colon cancer    No Known Allergies  Current Outpatient Prescriptions on File Prior to Visit  Medication Sig Dispense Refill  . Olmesartan-Amlodipine-HCTZ 20-5-12.5 MG TABS Take 1 tablet by mouth daily. 30 tablet 5  . omeprazole (PRILOSEC) 40 MG capsule TAKE ONE CAPSULE BY MOUTH EVERY DAY 30 capsule 5   No current facility-administered medications on file prior to visit.     BP 130/90 (BP Location: Right Arm, Cuff Size: Large)  Pulse 86   Temp 98.2 F (36.8 C) (Oral)   Resp 16   Ht 6' 3.5" (1.918 m)   Wt 299 lb (135.6 kg)   SpO2 97%   BMI 36.88 kg/m    Objective:   Physical Exam  Constitutional: He is oriented to person, place, and time. He appears well-developed and well-nourished. No distress.  HENT:  Head: Normocephalic and atraumatic.  Cardiovascular: Normal rate and regular rhythm.   No murmur heard. Pulmonary/Chest: Effort normal and breath sounds normal. No respiratory distress. He has no wheezes. He has no rales.  Musculoskeletal: He exhibits no edema.  Neurological: He is alert and oriented to person, place, and time.  Skin: Skin is warm and dry.  Psychiatric: He has a normal mood and affect. His behavior is normal. Thought content normal.          Assessment & Plan:  Morbid obesity- discussed importance of weight loss.  Hyperglycemia- noted on labs. Obtain A1C to further evaluate. We discussed avoiding concentrated sweets and limiting carbs.    HTN- BP stable. Continue tribenzor.  Anxiety- improved on  current dose of effexor. Continue same.

## 2017-04-09 NOTE — Patient Instructions (Signed)
Please complete lab work prior to leaving. Work on healthy diet, exercise, weight loss.   

## 2017-04-10 ENCOUNTER — Encounter: Payer: Self-pay | Admitting: Family

## 2017-05-04 ENCOUNTER — Encounter: Payer: Self-pay | Admitting: Family

## 2017-05-04 ENCOUNTER — Ambulatory Visit (INDEPENDENT_AMBULATORY_CARE_PROVIDER_SITE_OTHER): Payer: BLUE CROSS/BLUE SHIELD | Admitting: Family

## 2017-05-04 VITALS — BP 126/82 | HR 109 | Temp 97.6°F | Resp 18 | Ht 75.5 in | Wt 303.2 lb

## 2017-05-04 DIAGNOSIS — M25521 Pain in right elbow: Secondary | ICD-10-CM | POA: Diagnosis not present

## 2017-05-04 MED ORDER — MELOXICAM 7.5 MG PO TABS: 7.5000 mg | ORAL_TABLET | Freq: Every day | ORAL | 0 refills | Status: DC

## 2017-05-04 NOTE — Progress Notes (Signed)
Subjective:    Patient ID: Raymond Mathews, male    DOB: 22-Oct-1966, 51 y.o.   MRN: [id number]  HPI   Mr. Raymond Mathews is a 51 yr odl male who presents today with chief complaint of right elbow pain. Pain has been present x 1 week.  Reports that he was trying to push an [facility] unit up through a roof (weighed 120 lbs). Pain was mid until today. He was digging with a shovel and hit a pipe with the shovel.  Had sudden pain.      Review of Systems See HPI  Past Medical History:  Diagnosis Date  . Anxiety   . Cervical disc disease   . Chest pain    treadmill 12/17/10--no ischemia and no arrhythmia  . Clavicle fracture 04/2008   left comminuted clavicle fracture (complicated by wound infection)  . Diastolic dysfunction 12/17/10   by Echo  . Gastritis   . GERD (gastroesophageal reflux disease)   . Hepatic steatosis   . Hiatal hernia   . Neck pain   . Palpitations   . Pre-syncope    Echo and treadmill 12/17/10-- no further workup. EF 60%.  Diastolic dysfunction  . Tobacco abuse    history of  . Varicose veins      Social History   Social History  . Marital status: Single    Spouse name: N/A  . Number of children: 2  . Years of education: N/A   Occupational History  . OWNER [employer]   Social History Main Topics  . Smoking status: Former Smoker    Quit date: 08/05/2000  . Smokeless tobacco: Never Used  . Alcohol use No  . Drug use: No  . Sexual activity: Not on file   Other Topics Concern  . Not on file   Social History Narrative      Single  has two boys   Occupation:  Self-employed in Chief Operating Officer   Former Smoker quit in 2001   Alcohol use-no    Drug use-no      Poor diet - fast food    Daily Caffeine Use-coffee  1 cup coffee daily.            Past Surgical History:  Procedure Laterality Date  . FRACTURE SURGERY  04/2008   ORIF left clavicle using Katrinka Blazing and Matthew anatomic clavicle plate. Plate later removed.  . repair of  clavical      Family History  Problem Relation Age of Onset  . Cancer Mother        uterine  . Thyroid disease Mother   . Stroke Father   . Diabetes Other   . Heart disease Other   . Cancer Other        colon cancer    No Known Allergies  Current Outpatient Prescriptions on File Prior to Visit  Medication Sig Dispense Refill  . Olmesartan-Amlodipine-HCTZ 20-5-12.5 MG TABS Take 1 tablet by mouth daily. 30 tablet 5  . omeprazole (PRILOSEC) 40 MG capsule TAKE ONE CAPSULE BY MOUTH EVERY DAY 30 capsule 5  . venlafaxine XR (EFFEXOR XR) 75 MG 24 hr capsule Take 1 capsule (75 mg total) by mouth daily with breakfast. 90 capsule 1   No current facility-administered medications on file prior to visit.     BP 126/82 (BP Location: Left Arm, Cuff Size: Large)   Pulse (!) 109   Temp 97.6 F (36.4 C) (Oral)   Resp 18   Ht  6' 3.5" (1.918 m)   Wt (!) 303 lb 3.2 oz (137.5 kg)   SpO2 97%   BMI 37.40 kg/m       Objective:   Physical Exam  Constitutional: He is oriented to person, place, and time. He appears well-developed and well-nourished. No distress.  Musculoskeletal:  R elbow without swelling, Increased pain with extension of right forearm  Neurological: He is alert and oriented to person, place, and time.  Psychiatric: He has a normal mood and affect. His behavior is normal. Judgment and thought content normal.          Assessment & Plan:  R elbow pain- trial of meloxicam, offered x ray- he declines.  Reports + hx of "tendon tear" unclear which tendon but states he has seen ortho remotely for this. Will refer back to ortho for further evaluation.

## 2017-05-04 NOTE — Patient Instructions (Signed)
You will be contacted about your referral to orthopedics. Begin meloxicam once daily for your elbow pain.

## 2017-06-01 ENCOUNTER — Ambulatory Visit (INDEPENDENT_AMBULATORY_CARE_PROVIDER_SITE_OTHER): Payer: Self-pay | Admitting: Orthopaedic Surgery

## 2017-06-01 ENCOUNTER — Other Ambulatory Visit: Payer: Self-pay | Admitting: Family

## 2017-06-01 DIAGNOSIS — K219 Gastro-esophageal reflux disease without esophagitis: Secondary | ICD-10-CM

## 2017-09-06 ENCOUNTER — Other Ambulatory Visit: Payer: Self-pay | Admitting: Family

## 2017-09-07 NOTE — Telephone Encounter (Signed)
90 day supply of tribenzor sent to pharmacy. Pt last seen 04/2017 and was due for fasting cpe in August. Mailed letter to pt to schedule appt soon.

## 2017-09-16 ENCOUNTER — Telehealth: Payer: Self-pay | Admitting: *Deleted

## 2017-09-16 DIAGNOSIS — K219 Gastro-esophageal reflux disease without esophagitis: Secondary | ICD-10-CM

## 2017-09-16 MED ORDER — OMEPRAZOLE 40 MG PO CPDR: 40.0000 mg | DELAYED_RELEASE_CAPSULE | Freq: Every day | ORAL | 0 refills | Status: DC

## 2017-09-16 MED ORDER — OLMESARTAN-AMLODIPINE-HCTZ 20-5-12.5 MG PO TABS
1.0000 | ORAL_TABLET | Freq: Every day | ORAL | 0 refills | Status: DC
Start: 2017-09-16 — End: 2017-10-30

## 2017-09-16 NOTE — Telephone Encounter (Signed)
Received faxes from [facility] requesting 90 day supply of omeprazole and tribenzor. 90 day supply sent. Pt was due for cpe in August and is past due. Please call pt to schedule appt soon. Thanks!

## 2017-09-17 NOTE — Telephone Encounter (Signed)
lvm for pt to return call to schedule an apt.  °

## 2017-09-21 NOTE — Telephone Encounter (Signed)
Mailed letter to pt

## 2017-10-09 ENCOUNTER — Telehealth: Payer: Self-pay | Admitting: *Deleted

## 2017-10-09 MED ORDER — VENLAFAXINE HCL ER 75 MG PO CP24: 75.0000 mg | ORAL_CAPSULE | Freq: Every day | ORAL | 0 refills | Status: DC

## 2017-10-09 NOTE — Telephone Encounter (Signed)
Received fax from [facility] requesting 90 day supply of venlafaxine. Pt last in 03/2017 for routine follow up and advised cpe in 3 months. Pt is past due. 2 week supply sent to pharmacy. Previous phone message was left to call and schedule appt and letter previously mailed to pt.

## 2017-10-30 ENCOUNTER — Encounter: Payer: Self-pay | Admitting: Family

## 2017-10-30 ENCOUNTER — Ambulatory Visit: Payer: BLUE CROSS/BLUE SHIELD | Admitting: Family

## 2017-10-30 VITALS — BP 123/77 | HR 88 | Temp 97.8°F | Resp 18 | Ht 75.5 in | Wt 305.2 lb

## 2017-10-30 DIAGNOSIS — F419 Anxiety disorder, unspecified: Secondary | ICD-10-CM

## 2017-10-30 DIAGNOSIS — I10 Essential (primary) hypertension: Secondary | ICD-10-CM | POA: Diagnosis not present

## 2017-10-30 DIAGNOSIS — K219 Gastro-esophageal reflux disease without esophagitis: Secondary | ICD-10-CM | POA: Diagnosis not present

## 2017-10-30 LAB — BASIC METABOLIC PANEL
BUN: 21 mg/dL (ref 6–23)
CHLORIDE: 104 meq/L (ref 96–112)
CO2: 28 meq/L (ref 19–32)
Calcium: 9.2 mg/dL (ref 8.4–10.5)
Creatinine, Ser: 0.97 mg/dL (ref 0.40–1.50)
GFR: 86.86 mL/min (ref >60.00)
GLUCOSE: 114 mg/dL — AB (ref 70–99)
POTASSIUM: 3.7 meq/L (ref 3.5–5.1)
Sodium: 140 mEq/L (ref 135–145)

## 2017-10-30 MED ORDER — VENLAFAXINE HCL ER 75 MG PO CP24: 75.0000 mg | ORAL_CAPSULE | Freq: Every day | ORAL | 1 refills | Status: DC

## 2017-10-30 MED ORDER — OLMESARTAN-AMLODIPINE-HCTZ 20-5-12.5 MG PO TABS: 1.0000 | ORAL_TABLET | Freq: Every day | ORAL | 1 refills | Status: DC

## 2017-10-30 MED ORDER — OMEPRAZOLE 40 MG PO CPDR: 40.0000 mg | DELAYED_RELEASE_CAPSULE | Freq: Every day | ORAL | 1 refills | Status: DC

## 2017-10-30 NOTE — Progress Notes (Signed)
Subjective:    Patient ID: Raymond Mathews, male    DOB: 25-Apr-1966, 51 y.o.   MRN: [id number]  HPI  Mr.  Phillips Mathews is a 51 yr old male who presents today for follow up.  1) HTN- denies cp/sob or swelling.  BP Readings from Last 3 Encounters:  10/30/17 123/77  05/04/17 126/82  04/09/17 130/90   2) Anxiety- maintained on effexor. Reports anxiety is well controlled. Denies depression.   Wt Readings from Last 3 Encounters:  10/30/17 (!) 305 lb 3.2 oz (138.4 kg)  05/04/17 (!) 303 lb 3.2 oz (137.5 kg)  04/09/17 299 lb (135.6 kg)   3) GERD- reports stable on PPI.  He did have one episode of heartburn.  He is seeing GI and has been recommended to have an endoscopy and colonoscopy.  He has not yet followed through with this.  His sister was recently diagnosed with colon cancer.    Review of Systems See  HPI  Past Medical History:  Diagnosis Date  . Anxiety   . Cervical disc disease   . Chest pain    treadmill 12/17/10--no ischemia and no arrhythmia  . Clavicle fracture 04/2008   left comminuted clavicle fracture (complicated by wound infection)  . Diastolic dysfunction 12/17/10   by Echo  . Gastritis   . GERD (gastroesophageal reflux disease)   . Hepatic steatosis   . Hiatal hernia   . Neck pain   . Palpitations   . Pre-syncope    Echo and treadmill 12/17/10-- no further workup. EF 60%.  Diastolic dysfunction  . Tobacco abuse    history of  . Varicose veins      Social History   Socioeconomic History  . Marital status: Single    Spouse name: Not on file  . Number of children: 2  . Years of education: Not on file  . Highest education level: Not on file  Social Needs  . Financial resource strain: Not on file  . Food insecurity - worry: Not on file  . Food insecurity - inability: Not on file  . Transportation needs - medical: Not on file  . Transportation needs - non-medical: Not on file  Occupational History  . Occupation: OWNER    Employer: [employer]   Comment: construction  Tobacco Use  . Smoking status: Former Smoker    Last attempt to quit: 08/05/2000    Years since quitting: 17.2  . Smokeless tobacco: Never Used  Substance and Sexual Activity  . Alcohol use: No  . Drug use: No  . Sexual activity: Not on file  Other Topics Concern  . 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            Past Surgical History:  Procedure Laterality Date  . FRACTURE SURGERY  04/2008   ORIF left clavicle using Katrinka BlazingSMith and Matthew anatomic clavicle plate. Plate later removed.  . repair of clavical      Family History  Problem Relation Age of Onset  . Cancer Mother        uterine  . Thyroid disease Mother   . Stroke Father   . Diabetes Other   . Heart  disease Other   . Cancer Other        colon cancer    No Known Allergies  Current Outpatient Medications on File Prior to Visit  Medication Sig Dispense Refill  . Olmesartan-Amlodipine-HCTZ 20-5-12.5 MG TABS Take 1 tablet by mouth daily. 90 tablet 0  . omeprazole (PRILOSEC) 40 MG capsule Take 1 capsule (40 mg total) by mouth daily. 90 capsule 0  . venlafaxine XR (EFFEXOR XR) 75 MG 24 hr capsule Take 1 capsule (75 mg total) daily with breakfast by mouth. 14 capsule 0   No current facility-administered medications on file prior to visit.     BP 123/77 (BP Location: Left Arm, Cuff Size: Large)   Pulse 88   Temp 97.8 F (36.6 C) (Oral)   Resp 18   Ht 6' 3.5" (1.918 m)   Wt (!) 305 lb 3.2 oz (138.4 kg)   SpO2 100%   BMI 37.64 kg/m       Objective:   Physical Exam  Constitutional: He is oriented to person, place, and time. He appears well-developed and well-nourished. No distress.  HENT:  Head: Normocephalic and atraumatic.  Cardiovascular: Normal rate and regular  rhythm.  No murmur heard. Pulmonary/Chest: Effort normal and breath sounds normal. No respiratory distress. He has no wheezes. He has no rales.  Musculoskeletal: He exhibits no edema.  Neurological: He is alert and oriented to person, place, and time.  Skin: Skin is warm and dry.  Psychiatric: He has a normal mood and affect. His behavior is normal. Thought content normal.          Assessment & Plan:  Anxiety-stable on current medications continue same.  GERD-fair control.  I have advised patient to follow back up with his gastroenterologist and strongly encouraged him to follow through with colonoscopy due to family history.  Hypertension-stable on current medications continue same, obtain follow-up basic metabolic panel  Declines flu shot

## 2017-10-30 NOTE — Patient Instructions (Signed)
Please complete lab work prior to leaving.   

## 2017-10-31 ENCOUNTER — Encounter: Payer: Self-pay | Admitting: Family

## 2018-03-23 ENCOUNTER — Encounter: Payer: Self-pay | Admitting: Medical

## 2018-03-23 ENCOUNTER — Ambulatory Visit: Payer: BLUE CROSS/BLUE SHIELD | Admitting: Medical

## 2018-03-23 VITALS — BP 116/81 | HR 89 | Temp 97.7°F | Resp 16 | Ht 75.5 in | Wt 303.0 lb

## 2018-03-23 DIAGNOSIS — H6690 Otitis media, unspecified, unspecified ear: Secondary | ICD-10-CM

## 2018-03-23 DIAGNOSIS — J0100 Acute maxillary sinusitis, unspecified: Secondary | ICD-10-CM

## 2018-03-23 DIAGNOSIS — R05 Cough: Secondary | ICD-10-CM

## 2018-03-23 DIAGNOSIS — R059 Cough, unspecified: Secondary | ICD-10-CM

## 2018-03-23 DIAGNOSIS — J40 Bronchitis, not specified as acute or chronic: Secondary | ICD-10-CM

## 2018-03-23 MED ORDER — AMOXICILLIN-POT CLAVULANATE 875-125 MG PO TABS: 1.0000 | ORAL_TABLET | Freq: Two times a day (BID) | ORAL | 0 refills | Status: DC

## 2018-03-23 MED ORDER — NEOMYCIN-POLYMYXIN-HC 1 % OT SOLN: 3.0000 [drp] | Freq: Three times a day (TID) | OTIC | 0 refills | Status: DC

## 2018-03-23 MED ORDER — FLUTICASONE PROPIONATE 50 MCG/ACT NA SUSP: 2.0000 | Freq: Every day | NASAL | 1 refills | Status: DC

## 2018-03-23 MED ORDER — HYDROCODONE-HOMATROPINE 5-1.5 MG/5ML PO SYRP: 5.0000 mL | ORAL_SOLUTION | Freq: Three times a day (TID) | ORAL | 0 refills | Status: DC | PRN

## 2018-03-23 MED ORDER — CEFTRIAXONE SODIUM 1 G IJ SOLR
1.0000 g | Freq: Once | INTRAMUSCULAR | Status: AC
Start: 2018-03-23 — End: 2018-03-23
  Administered 2018-03-23: 1 g via INTRAMUSCULAR

## 2018-03-23 NOTE — Patient Instructions (Addendum)
You do appear to have sinus infection with severe right ear infection.  I do think it would be beneficial to give you Rocephin 1 g injection now and have you start Augmentin as well.  You can use Aleve to help with pain while the antibiotic start to take effect.  Also appear to have some bronchitis type symptoms as well.  For severe cough I prescribed Hycodan.  Rx advisement given.  If you develop any wheezing and you do have albuterol solution at home that you can use with your son's neb machine.  Expect your symptoms to gradually resolve.  However if you have severe persisting ear pain despite the above measures and please let me know.  It is rare but you might need a second injection of Rocephin.  Some swelling of the right ear canal as well and will prescribe Cortisporin otic drops.  Rx flonase for nasal congestion   Follow-up in 7 days or as needed.

## 2018-03-23 NOTE — Progress Notes (Signed)
Subjective:    Patient ID: Raymond Mathews, male    DOB: 22-Aug-1966, 52 y.o.   MRN: [id number]  HPI  Pt in for 3 days of cough. Cough moderate to severe. He states very productive cough and blowing out mucus from his nose. This morning has moderate to severe.  No fever, no chills or sweats. But then he feels warm recently.  Pt has not been wheezing. But he did get desperate and try son albuterol Seemed to decrease cough little.  Pt used to smoke. Quit in 2001.  Pain in rt ear is moderate to severe. 1st noticed this at 4 am. Some nasal congestion and sinus infection.  Review of Systems  Constitutional: Negative for chills and fatigue.  HENT: Positive for congestion, ear pain, sinus pressure and sinus pain. Negative for rhinorrhea and sore throat.   Respiratory: Positive for cough. Negative for chest tightness, shortness of breath and wheezing.   Cardiovascular: Negative for chest pain and palpitations.  Gastrointestinal: Negative for abdominal pain.  Musculoskeletal: Negative for back pain.  Skin: Negative for pallor and rash.  Neurological: Negative for tremors, speech difficulty, weakness, light-headedness, numbness and headaches.  Hematological: Negative for adenopathy. Does not bruise/bleed easily.  Psychiatric/Behavioral: Negative for behavioral problems, confusion, dysphoric mood and sleep disturbance. The patient is not nervous/anxious and is not hyperactive.    Past Medical History:  Diagnosis Date  . Anxiety   . Cervical disc disease   . Chest pain    treadmill 12/17/10--no ischemia and no arrhythmia  . Clavicle fracture 04/2008   left comminuted clavicle fracture (complicated by wound infection)  . Diastolic dysfunction 12/17/10   by Echo  . Gastritis   . GERD (gastroesophageal reflux disease)   . Hepatic steatosis   . Hiatal hernia   . Neck pain   . Palpitations   . Pre-syncope    Echo and treadmill 12/17/10-- no further workup. EF 60%.  Diastolic dysfunction  .  Tobacco abuse    history of  . Varicose veins      Social History   Socioeconomic History  . Marital status: Single    Spouse name: Not on file  . Number of children: 2  . Years of education: Not on file  . Highest education level: Not on file  Occupational History  . Occupation: OWNER    Employer: Engineer, manufacturing    Comment: Holiday representative  Social Needs  . Financial resource strain: Not on file  . Food insecurity:    Worry: Not on file    Inability: Not on file  . Transportation needs:    Medical: Not on file    Non-medical: Not on file  Tobacco Use  . Smoking status: Former Smoker    Last attempt to quit: 08/05/2000    Years since quitting: 17.6  . Smokeless tobacco: Never Used  Substance and Sexual Activity  . Alcohol use: No  . Drug use: No  . Sexual activity: Not on file  Lifestyle  . Physical activity:    Days per week: Not on file    Minutes per session: Not on file  . Stress: Not on file  Relationships  . Social connections:    Talks on phone: Not on file    Gets together: Not on file    Attends religious service: Not on file    Active member of club or organization: Not on file    Attends meetings of clubs or organizations: Not on file  Relationship status: Not on file  . Intimate partner violence:    Fear of current or ex partner: Not on file    Emotionally abused: Not on file    Physically abused: Not on file    Forced sexual activity: Not on file  Other Topics Concern  . Not on file  Social History Narrative      Single  has two boys   Occupation:  Self-employed in Chief Operating Officer   Former Smoker quit in 2001   Alcohol use-no    Drug use-no      Poor diet - fast food    Daily Caffeine Use-coffee  1 cup coffee daily.            Past Surgical History:  Procedure Laterality Date  . FRACTURE SURGERY  04/2008   ORIF left clavicle using Katrinka Blazing and Matthew anatomic clavicle plate. Plate later removed.  . repair of clavical       Family History  Problem Relation Age of Onset  . Cancer Mother        uterine  . Thyroid disease Mother   . Stroke Father   . Diabetes Other   . Heart disease Other   . Cancer Other        colon cancer    No Known Allergies  Current Outpatient Medications on File Prior to Visit  Medication Sig Dispense Refill  . Olmesartan-Amlodipine-HCTZ 20-5-12.5 MG TABS Take 1 tablet by mouth daily. 90 tablet 1  . omeprazole (PRILOSEC) 40 MG capsule Take 1 capsule (40 mg total) by mouth daily. 90 capsule 1  . venlafaxine XR (EFFEXOR XR) 75 MG 24 hr capsule Take 1 capsule (75 mg total) by mouth daily with breakfast. 90 capsule 1   No current facility-administered medications on file prior to visit.     BP 116/81 (BP Location: Left Arm, Patient Position: Sitting, Cuff Size: Large)   Pulse 89   Temp 97.7 F (36.5 C) (Oral)   Resp 16   Ht 6' 3.5" (1.918 m)   Wt (!) 303 lb (137.4 kg)   SpO2 95%   BMI 37.37 kg/m      Objective:   Physical Exam  General  Mental Status - Alert. General Appearance - Well groomed. Not in acute distress.  Skin Rashes- No Rashes.  HEENT Head- Normal. Ear Auditory Canal - Left- Normal. Right - Normal.Tympanic Membrane- Left- Normal. Right- Severe rt side very red  tm. Canal looks mild swollen. Eye Sclera/Conjunctiva- Left- Normal. Right- Normal. Nose & Sinuses Nasal Mucosa- Left-  Boggy and Congested. Right-  Boggy and  Congested . Rt side maxillary pressure but no  frontal sinus pressure. Mouth & Throat Lips: Upper Lip- Normal: no dryness, cracking, pallor, cyanosis, or vesicular eruption. Lower Lip-Normal: no dryness, cracking, pallor, cyanosis or vesicular eruption. Buccal Mucosa- Bilateral- No Aphthous ulcers. Oropharynx- No Discharge or Erythema. Tonsils: Characteristics- Bilateral- No Erythema or Congestion. Size/Enlargement- Bilateral- No enlargement. Discharge- bilateral-None.  Neck Neck- Supple. No Masses.   Chest and Lung  Exam Auscultation: Breath Sounds:-Clear even and unlabored.  Cardiovascular Auscultation:Rythm- Regular, rate and rhythm. Murmurs & Other Heart Sounds:Ausculatation of the heart reveal- No Murmurs.  Lymphatic Head & Neck General Head & Neck Lymphatics: Bilateral: Description- No Localized lymphadenopathy.       Assessment & Plan:  You do appear to have sinus infection with severe right ear infection.  I do think it would be beneficial to give you Rocephin 1 g injection now and have you  start Augmentin as well.  You can use Aleve to help with pain while the antibiotic start to take effect.  Also appear to have some bronchitis type symptoms as well.  For severe cough I prescribed Hycodan.  Rx advisement given.  If you develop any wheezing and you do have albuterol solution at home that you can use with your son's neb machine.  Expect your symptoms to gradually resolve.  However if you have severe persisting ear pain despite the above measures and please let me know.  It is rare but you might need a second injection of Rocephin.  Some swelling of the right ear canal as well and will prescribe Cortisporin otic drops.  Rx flonase for nasal congestion.  Follow-up in 7 days or as needed.  Esperanza Richters, PA-C

## 2018-04-14 ENCOUNTER — Other Ambulatory Visit: Payer: Self-pay | Admitting: Medical

## 2018-05-01 ENCOUNTER — Telehealth: Payer: Self-pay | Admitting: Family

## 2018-05-01 DIAGNOSIS — F411 Generalized anxiety disorder: Secondary | ICD-10-CM

## 2018-05-03 NOTE — Telephone Encounter (Signed)
Pt requesting refill on effexor. Pt. Last office visit with PCP 10/2017. Routed to Sandford CrazeMelissa, O'Sullivan for review.

## 2018-05-05 NOTE — Telephone Encounter (Signed)
Pt calling to check status on med refill he will be leaving town for a week tomorrow. He only has 1 pill left.

## 2018-05-05 NOTE — Telephone Encounter (Signed)
30 day rx sent, due for follow up please.

## 2018-05-12 NOTE — Telephone Encounter (Signed)
Called pt and informed him that he would need a follow up visit before he could receive any further refills. Pt stated that he would call back and schedule an appt when he knows his work schedule.

## 2018-05-24 ENCOUNTER — Ambulatory Visit: Payer: BLUE CROSS/BLUE SHIELD | Admitting: Family

## 2018-05-24 ENCOUNTER — Encounter: Payer: Self-pay | Admitting: Family

## 2018-05-24 VITALS — BP 108/73 | HR 82 | Temp 97.8°F | Resp 16 | Ht 75.5 in | Wt 303.0 lb

## 2018-05-24 DIAGNOSIS — K219 Gastro-esophageal reflux disease without esophagitis: Secondary | ICD-10-CM | POA: Diagnosis not present

## 2018-05-24 DIAGNOSIS — F411 Generalized anxiety disorder: Secondary | ICD-10-CM

## 2018-05-24 DIAGNOSIS — R059 Cough, unspecified: Secondary | ICD-10-CM

## 2018-05-24 DIAGNOSIS — I10 Essential (primary) hypertension: Secondary | ICD-10-CM

## 2018-05-24 DIAGNOSIS — R05 Cough: Secondary | ICD-10-CM

## 2018-05-24 LAB — BASIC METABOLIC PANEL
BUN: 22 mg/dL (ref 6–23)
CALCIUM: 9.4 mg/dL (ref 8.4–10.5)
CHLORIDE: 102 meq/L (ref 96–112)
CO2: 30 meq/L (ref 19–32)
Creatinine, Ser: 1.2 mg/dL (ref 0.40–1.50)
GFR: 67.8 mL/min (ref >60.00)
GLUCOSE: 100 mg/dL — AB (ref 70–99)
Potassium: 4.6 mEq/L (ref 3.5–5.1)
SODIUM: 139 meq/L (ref 135–145)

## 2018-05-24 MED ORDER — ALBUTEROL SULFATE 108 (90 BASE) MCG/ACT IN AEPB: 2.0000 | INHALATION_SPRAY | Freq: Four times a day (QID) | RESPIRATORY_TRACT | 0 refills | Status: DC | PRN

## 2018-05-24 MED ORDER — OMEPRAZOLE 40 MG PO CPDR: 40.0000 mg | DELAYED_RELEASE_CAPSULE | Freq: Every day | ORAL | 1 refills | Status: DC

## 2018-05-24 MED ORDER — VENLAFAXINE HCL ER 75 MG PO CP24: 75.0000 mg | ORAL_CAPSULE | Freq: Every day | ORAL | 1 refills | Status: DC

## 2018-05-24 MED ORDER — OLMESARTAN-AMLODIPINE-HCTZ 20-5-12.5 MG PO TABS: 1.0000 | ORAL_TABLET | Freq: Every day | ORAL | 1 refills | Status: DC

## 2018-05-24 MED ORDER — BENZONATATE 100 MG PO CAPS: 100.0000 mg | ORAL_CAPSULE | Freq: Three times a day (TID) | ORAL | 0 refills | Status: DC | PRN

## 2018-05-24 NOTE — Patient Instructions (Signed)
Please complete lab work prior to leaving.   

## 2018-05-24 NOTE — Progress Notes (Signed)
Subjective:    Patient ID: Raymond Mathews, male    DOB: 03-22-1966, 52 y.o.   MRN: [id number]  HPI  Pt is a 52 yr old male who presents today with chief complaint of cough. Cough as been present x 1 week. Had a dust exposure. Denies wheezing. Cough is "twice as bad when I lay down at night."  Reports cough is dry.  Denies gerd symptoms.   HTN- maintained on tribenzor.  Denies dizziness.  BP Readings from Last 3 Encounters:  05/24/18 108/73  03/23/18 116/81  10/30/17 123/77   Anxiety- reports well controlled on effexor  Review of Systems    see HPI  Past Medical History:  Diagnosis Date  . Anxiety   . Cervical disc disease   . Chest pain    treadmill 12/17/10--no ischemia and no arrhythmia  . Clavicle fracture 04/2008   left comminuted clavicle fracture (complicated by wound infection)  . Diastolic dysfunction 12/17/10   by Echo  . Gastritis   . GERD (gastroesophageal reflux disease)   . Hepatic steatosis   . Hiatal hernia   . Neck pain   . Palpitations   . Pre-syncope    Echo and treadmill 12/17/10-- no further workup. EF 60%.  Diastolic dysfunction  . Tobacco abuse    history of  . Varicose veins      Social History   Socioeconomic History  . Marital status: Single    Spouse name: Not on file  . Number of children: 2  . Years of education: Not on file  . Highest education level: Not on file  Occupational History  . Occupation: OWNER    Employer: Engineer, manufacturing    Comment: Holiday representative  Social Needs  . Financial resource strain: Not on file  . Food insecurity:    Worry: Not on file    Inability: Not on file  . Transportation needs:    Medical: Not on file    Non-medical: Not on file  Tobacco Use  . Smoking status: Former Smoker    Last attempt to quit: 08/05/2000    Years since quitting: 17.8  . Smokeless tobacco: Never Used  Substance and Sexual Activity  . Alcohol use: No  . Drug use: No  . Sexual activity: Not on file  Lifestyle  .  Physical activity:    Days per week: Not on file    Minutes per session: Not on file  . Stress: Not on file  Relationships  . Social connections:    Talks on phone: Not on file    Gets together: Not on file    Attends religious service: Not on file    Active member of club or organization: Not on file    Attends meetings of clubs or organizations: Not on file    Relationship status: Not on file  . Intimate partner violence:    Fear of current or ex partner: Not on file    Emotionally abused: Not on file    Physically abused: Not on file    Forced sexual activity: Not on file  Other Topics Concern  . Not on file  Social History Narrative      Single  has two boys   Occupation:  Self-employed in Chief Operating Officer   Former Smoker quit in 2001   Alcohol use-no    Drug use-no      Poor diet - fast food    Daily Caffeine Use-coffee  1 cup coffee daily.  Past Surgical History:  Procedure Laterality Date  . FRACTURE SURGERY  04/2008   ORIF left clavicle using Katrinka BlazingSMith and Matthew anatomic clavicle plate. Plate later removed.  . repair of clavical      Family History  Problem Relation Age of Onset  . Cancer Mother        uterine  . Thyroid disease Mother   . Stroke Father   . Diabetes Other   . Heart disease Other   . Cancer Other        colon cancer    No Known Allergies  Current Outpatient Medications on File Prior to Visit  Medication Sig Dispense Refill  . HYDROcodone-homatropine (HYCODAN) 5-1.5 MG/5ML syrup Take 5 mLs by mouth every 8 (eight) hours as needed for cough. 100 mL 0  . NEOMYCIN-POLYMYXIN-HYDROCORTISONE (CORTISPORIN) 1 % SOLN OTIC solution Place 3 drops into the right ear every 8 (eight) hours. 10 mL 0  . Olmesartan-Amlodipine-HCTZ 20-5-12.5 MG TABS Take 1 tablet by mouth daily. 90 tablet 1  . omeprazole (PRILOSEC) 40 MG capsule Take 1 capsule (40 mg total) by mouth daily. 90 capsule 1  . venlafaxine XR (EFFEXOR-XR) 75 MG 24 hr capsule TAKE  1 CAPSULE (75 MG TOTAL) BY MOUTH DAILY WITH BREAKFAST. 30 capsule 0   No current facility-administered medications on file prior to visit.     BP 108/73 (BP Location: Right Arm, Patient Position: Sitting, Cuff Size: Large)   Pulse 82   Temp 97.8 F (36.6 C) (Oral)   Resp 16   Ht 6' 3.5" (1.918 m)   Wt (!) 303 lb (137.4 kg)   SpO2 98%   BMI 37.37 kg/m    Objective:   Physical Exam  Constitutional: He is oriented to person, place, and time. He appears well-developed and well-nourished. No distress.  HENT:  Head: Normocephalic and atraumatic.  Cardiovascular: Normal rate and regular rhythm.  No murmur heard. Pulmonary/Chest: Effort normal and breath sounds normal. No respiratory distress. He has no wheezes. He has no rales.  Musculoskeletal: He exhibits no edema.  Neurological: He is alert and oriented to person, place, and time.  Skin: Skin is warm and dry.  Psychiatric: He has a normal mood and affect. His behavior is normal. Thought content normal.          Assessment & Plan:  Cough- some improvement with albuterol- ? Asthma variant. Rx with albuterol, prn tessalon. Follow up if symptoms worsen or fail to improve.Continue PPI for reflux symptoms.  HTN- bp a bit on low side but he denies dizziness- will continue to monitor.  Anxiety- stable on zoloft, continue same.

## 2018-05-25 ENCOUNTER — Encounter: Payer: Self-pay | Admitting: Family

## 2018-06-15 ENCOUNTER — Other Ambulatory Visit: Payer: Self-pay | Admitting: Medical

## 2018-11-15 ENCOUNTER — Other Ambulatory Visit: Payer: Self-pay | Admitting: Family

## 2018-11-15 DIAGNOSIS — K219 Gastro-esophageal reflux disease without esophagitis: Secondary | ICD-10-CM

## 2018-12-01 ENCOUNTER — Other Ambulatory Visit: Payer: Self-pay | Admitting: Family

## 2018-12-01 DIAGNOSIS — F411 Generalized anxiety disorder: Secondary | ICD-10-CM

## 2018-12-01 NOTE — Telephone Encounter (Signed)
30 day supply of venlafaxine sent to pharmacy. Pt last seen by PCP 05/2017 and was advised to follow up in 1 month. Pt is past due. Please call pt to schedule appt before further refills are needed. Thanks!

## 2018-12-07 NOTE — Telephone Encounter (Signed)
Called patient left msg for him to have an appt before any other refills can be filled.

## 2018-12-15 ENCOUNTER — Other Ambulatory Visit: Payer: Self-pay | Admitting: Family

## 2018-12-16 NOTE — Telephone Encounter (Signed)
30 day supply sent.  Needs OV prior to additional refills.

## 2018-12-17 NOTE — Telephone Encounter (Signed)
Called but no answer, lm for patient to be aware of 30 days refill and to call for appointment.

## 2018-12-27 ENCOUNTER — Other Ambulatory Visit: Payer: Self-pay | Admitting: Family

## 2018-12-27 DIAGNOSIS — F411 Generalized anxiety disorder: Secondary | ICD-10-CM

## 2019-01-01 ENCOUNTER — Telehealth: Payer: Self-pay | Admitting: Family

## 2019-01-05 MED ORDER — OLMESARTAN-AMLODIPINE-HCTZ 20-5-12.5 MG PO TABS: 1.0000 | ORAL_TABLET | Freq: Every day | ORAL | 0 refills | Status: DC

## 2019-01-05 NOTE — Telephone Encounter (Signed)
30 day supply has been sent. Please advise pt that he needs follow up prior to additional refills.

## 2019-01-05 NOTE — Addendum Note (Signed)
Addended by: Sandford Craze on: 01/05/2019 09:38 AM   Modules accepted: Orders

## 2019-01-06 NOTE — Telephone Encounter (Signed)
Lm for patient to be aware he needs to call for his 6 months f/up appointment.

## 2019-02-07 ENCOUNTER — Telehealth: Payer: Self-pay

## 2019-02-07 NOTE — Telephone Encounter (Signed)
Copied from CRM [id number]. Topic: General - Other >> Feb 07, 2019  4:27 PM Laural Benes, [place] C wrote: Reason for CRM: Pt says that he was advised to have medication follow up.  pt says that he doesn't want to come in to the office due to the virus. Pt would like to know if he have to come in, pt would like to put this apt off for a month    CB: [phone]

## 2019-02-08 ENCOUNTER — Other Ambulatory Visit: Payer: Self-pay

## 2019-02-08 MED ORDER — OLMESARTAN-AMLODIPINE-HCTZ 20-5-12.5 MG PO TABS: 1.0000 | ORAL_TABLET | Freq: Every day | ORAL | 1 refills | Status: DC

## 2019-02-08 NOTE — Telephone Encounter (Signed)
Patient advised 30 days supply with one refilled ok per Efraim Kaufmann, sent to his local pharmacy.

## 2019-02-28 ENCOUNTER — Telehealth: Payer: Self-pay | Admitting: Family

## 2019-02-28 NOTE — Telephone Encounter (Signed)
Please contact pt to arrange follow up visit. He should check bp prior to visit if able.

## 2019-03-01 NOTE — Telephone Encounter (Signed)
Contacted pt. He will be scheduled for virtual visit tomorrow at 2:40.

## 2019-03-01 NOTE — Telephone Encounter (Signed)
Scheduled for tomorrow at 2:40

## 2019-03-02 ENCOUNTER — Other Ambulatory Visit: Payer: Self-pay

## 2019-03-02 ENCOUNTER — Ambulatory Visit (INDEPENDENT_AMBULATORY_CARE_PROVIDER_SITE_OTHER): Payer: BLUE CROSS/BLUE SHIELD | Admitting: Family

## 2019-03-02 DIAGNOSIS — Z5329 Procedure and treatment not carried out because of patient's decision for other reasons: Secondary | ICD-10-CM

## 2019-03-02 DIAGNOSIS — Z91199 Patient's noncompliance with other medical treatment and regimen due to unspecified reason: Secondary | ICD-10-CM

## 2019-03-02 NOTE — Progress Notes (Signed)
Pt no showed today's virtual visit.

## 2019-03-14 ENCOUNTER — Ambulatory Visit (INDEPENDENT_AMBULATORY_CARE_PROVIDER_SITE_OTHER): Payer: BLUE CROSS/BLUE SHIELD | Admitting: Family

## 2019-03-14 ENCOUNTER — Telehealth: Payer: Self-pay | Admitting: Family

## 2019-03-14 ENCOUNTER — Other Ambulatory Visit: Payer: Self-pay

## 2019-03-14 ENCOUNTER — Encounter: Payer: Self-pay | Admitting: Family

## 2019-03-14 DIAGNOSIS — F411 Generalized anxiety disorder: Secondary | ICD-10-CM | POA: Diagnosis not present

## 2019-03-14 DIAGNOSIS — K219 Gastro-esophageal reflux disease without esophagitis: Secondary | ICD-10-CM

## 2019-03-14 DIAGNOSIS — I10 Essential (primary) hypertension: Secondary | ICD-10-CM

## 2019-03-14 MED ORDER — VENLAFAXINE HCL ER 75 MG PO CP24: 75.0000 mg | ORAL_CAPSULE | Freq: Every day | ORAL | 2 refills | Status: DC

## 2019-03-14 MED ORDER — OLMESARTAN-AMLODIPINE-HCTZ 20-5-12.5 MG PO TABS: 1.0000 | ORAL_TABLET | Freq: Every day | ORAL | 2 refills | Status: AC

## 2019-03-14 MED ORDER — OMEPRAZOLE 40 MG PO CPDR: DELAYED_RELEASE_CAPSULE | ORAL | 3 refills | Status: DC

## 2019-03-14 NOTE — Progress Notes (Signed)
Virtual Visit via Video Note  I connected with Raymond Mathews on 03/14/19 at  8:00 AM EDT by a video enabled telemedicine application and verified that I am speaking with the correct person using two identifiers. This visit type was conducted due to national recommendations for restrictions regarding the COVID-19 Pandemic (e.g. social distancing).  This format is felt to be most appropriate for this patient at this time.   I discussed the limitations of evaluation and management by telemedicine and the availability of in person appointments. The patient expressed understanding and agreed to proceed. Only the patient and myself were on today's video visit. The patient was in his parked car and I was at home at the time of today's visit.   History of Present Illness:  HTN- bp meds include tribenzor. He has not been checking his blood pressure. States that he is feeling well though and has good  BP Readings from Last 3 Encounters:  05/24/18 108/73  03/23/18 116/81  10/30/17 123/77   Anxiety- maintained on effexor.  Reports anxiety is stable.  Denies depression.  GERD-  Continues prilosec.  Reports symptoms are well controlled. States that GI wants to do an an endoscopy which he plans to schedule.  Observations/Objective:   Gen: Awake, alert, no acute distress Resp: Breathing is even and non-labored Psych: calm/pleasant demeanor Neuro: Alert and Oriented x 3, + facial symmetry, speech is clear.   Assessment and Plan:  HTN- I have asked the patient to check  His bp at home and send me his reading via mychart.  Will plan to bring him in for a face to face visit in 3 months with lab work.  GERD- stable on PPI, continue same.  Anxiety- stable on effexor. Continue same.   Follow Up Instructions:    I discussed the assessment and treatment plan with the patient. The patient was provided an opportunity to ask questions and all were answered. The patient agreed with the plan and  demonstrated an understanding of the instructions.   The patient was advised to call back or seek an in-person evaluation if the symptoms worsen or if the condition fails to improve as anticipated.    Lemont Fillers, NP

## 2019-03-14 NOTE — Telephone Encounter (Signed)
Called Pt to set up follow up in 3 months follow left msg

## 2019-05-31 ENCOUNTER — Other Ambulatory Visit: Payer: Self-pay | Admitting: Family

## 2019-05-31 DIAGNOSIS — K219 Gastro-esophageal reflux disease without esophagitis: Secondary | ICD-10-CM

## 2019-05-31 MED ORDER — OMEPRAZOLE 40 MG PO CPDR: 40.0000 mg | DELAYED_RELEASE_CAPSULE | Freq: Every day | ORAL | 5 refills | Status: DC

## 2019-05-31 NOTE — Telephone Encounter (Signed)
Refills sent

## 2019-05-31 NOTE — Telephone Encounter (Signed)
REFILL omeprazole (PRILOSEC) 40 MG capsule  PHARM[facility]Y [id number] - [place], [place] - [address] AT [place] (Phone) [phone] (Fax)

## 2019-06-01 ENCOUNTER — Ambulatory Visit (INDEPENDENT_AMBULATORY_CARE_PROVIDER_SITE_OTHER): Payer: BLUE CROSS/BLUE SHIELD | Admitting: Family

## 2019-06-01 DIAGNOSIS — M5412 Radiculopathy, cervical region: Secondary | ICD-10-CM | POA: Diagnosis not present

## 2019-06-01 MED ORDER — METHYLPREDNISOLONE 4 MG PO TBPK: ORAL_TABLET | ORAL | 0 refills | Status: AC

## 2019-06-01 NOTE — Progress Notes (Signed)
Virtual Visit via Video Note  I connected with Raymond Mathews on 06/01/19 at 10:40 AM EDT by a video enabled telemedicine application and verified that I am speaking with the correct person using two identifiers.  Location: Patient: work Restaurant manager, fast foodrovider: work   I discussed the limitations of evaluation and management by telemedicine and the availability of in person appointments. The patient expressed understanding and agreed to proceed.  History of Present Illness:   Patient is a 53 yr old male who presents today with chief complaint of severe neck pain.  Reports hx of C4-C5 disc disease. Reports that he saw Dr. Wynetta Emeryram "14 years ago and was told I needed surgery." Pt declined surgery because "I can't take that much time off of work."  Reports pain runs down the left arm "like crazy."  Reports that pain started Sunday evening. Has been using ibuprofen without improvement. He reports + weakness in the left arm which is chronic but is a bit worse than usual at this time. He denies LE weakness.  Denies bowel/bladder accidents. No alleviating factors.   Past Medical History:  Diagnosis Date  . Anxiety   . Cervical disc disease   . Chest pain    treadmill 12/17/10--no ischemia and no arrhythmia  . Clavicle fracture 04/2008   left comminuted clavicle fracture (complicated by wound infection)  . Diastolic dysfunction 12/17/10   by Echo  . Gastritis   . GERD (gastroesophageal reflux disease)   . Hepatic steatosis   . Hiatal hernia   . Neck pain   . Palpitations   . Pre-syncope    Echo and treadmill 12/17/10-- no further workup. EF 60%.  Diastolic dysfunction  . Tobacco abuse    history of  . Varicose veins      Social History   Socioeconomic History  . Marital status: Single    Spouse name: Not on file  . Number of children: 2  . Years of education: Not on file  . Highest education level: Not on file  Occupational History  . Occupation: OWNER    Employer: Engineer, [employer]    Comment:  Holiday representativeconstruction  Social Needs  . Financial resource strain: Not on file  . Food insecurity    Worry: Not on file    Inability: Not on file  . Transportation needs    Medical: Not on file    Non-medical: Not on file  Tobacco Use  . Smoking status: Former Smoker    Quit date: 08/05/2000    Years since quitting: 18.8  . Smokeless tobacco: Never Used  Substance and Sexual Activity  . Alcohol use: No  . Drug use: No  . Sexual activity: Not on file  Lifestyle  . Physical activity    Days per week: Not on file    Minutes per session: Not on file  . Stress: Not on file  Relationships  . Social Musicianconnections    Talks on phone: Not on file    Gets together: Not on file    Attends religious service: Not on file    Active member of club or organization: Not on file    Attends meetings of clubs or organizations: Not on file    Relationship status: Not on file  . Intimate partner violence    Fear of current or ex partner: Not on file    Emotionally abused: Not on file    Physically abused: Not on file    Forced sexual activity: Not on file  Other  Topics Concern  . Not on file  Social History Narrative   Single  has two boys   Occupation:  Self-employed in English as a second language teacher   Former Smoker quit in 2001   Alcohol use-no    Drug use-no      Poor diet - fast food    Daily Caffeine Use-coffee  1 cup coffee daily.            Past Surgical History:  Procedure Laterality Date  . FRACTURE SURGERY  04/2008   ORIF left clavicle using Tamala Julian and Matthew anatomic clavicle plate. Plate later removed.  . repair of clavical      Family History  Problem Relation Age of Onset  . Cancer Mother        uterine  . Thyroid disease Mother   . Stroke Father   . Diabetes Other   . Heart disease Other   . Cancer Other        colon cancer    No Known Allergies  Current Outpatient Medications on File Prior to Visit  Medication Sig Dispense Refill  . Olmesartan-amLODIPine-HCTZ 20-5-12.5 MG  TABS Take 1 tablet by mouth daily. 30 tablet 2  . omeprazole (PRILOSEC) 40 MG capsule Take 1 capsule (40 mg total) by mouth daily. 30 capsule 5  . venlafaxine XR (EFFEXOR-XR) 75 MG 24 hr capsule Take 1 capsule (75 mg total) by mouth daily with breakfast. 30 capsule 2   No current facility-administered medications on file prior to visit.     There were no vitals taken for this visit.   Observations/Objective:   Gen: Awake, alert, no acute distress Resp: Breathing is even and non-labored Psych: calm/pleasant demeanor Neuro: Alert and Oriented x 3, + facial symmetry, speech is clear.   Assessment and Plan:  Cervical radiculopathy- deteriorated. Will rx with medrol dose Pak. Pt is advised to call if new/worsening symptoms or if not improved in 1 week. Would plan to repeat MRI at that time. Pt verbalizes understanding and agrees to plan.  Addendum: 90% of this visit was performed via video.  However due to poor connection we transitioned the end of the visit to the telephone.  12 minutes and all were spent on today's phone call/video visit.  Follow Up Instructions:    I discussed the assessment and treatment plan with the patient. The patient was provided an opportunity to ask questions and all were answered. The patient agreed with the plan and demonstrated an understanding of the instructions.   The patient was advised to call back or seek an in-person evaluation if the symptoms worsen or if the condition fails to improve as anticipated.  Nance Pear, NP

## 2019-06-08 ENCOUNTER — Telehealth: Payer: Self-pay

## 2019-06-08 DIAGNOSIS — M5412 Radiculopathy, cervical region: Secondary | ICD-10-CM

## 2019-06-08 NOTE — Telephone Encounter (Signed)
Per patient his pain is not better,he will like to know if he needs specialist referral or does he need to be set up for MRI?

## 2019-06-08 NOTE — Telephone Encounter (Signed)
lvm for patient to be aware he will get a call to set up MRI date and time.

## 2019-06-08 NOTE — Telephone Encounter (Signed)
Copied from [place] [id number]. Topic: General - Other >> Jun 08, 2019 10:45 AM Rainey Pines A wrote: Patient called to inform  provider that his neck pain has not gotten better and would like a callback a nurse.

## 2019-06-08 NOTE — Telephone Encounter (Signed)
I have placed MRI order. After that is complete we can refer him back to Dr. Saintclair Halsted.

## 2019-06-14 ENCOUNTER — Encounter: Payer: Self-pay | Admitting: Family

## 2019-06-14 ENCOUNTER — Telehealth: Payer: Self-pay | Admitting: Family

## 2019-06-14 NOTE — Telephone Encounter (Signed)
Patient states she has not been able to work due to the pain. He  Would like a letter for work explaining why he's been out. Please advise. Pt stated it was okay to call him.

## 2019-06-14 NOTE — Telephone Encounter (Signed)
Please contact pt and let him know that I wrote a letter for him (please leave copy at the front desk).  Also, I got insurance coverage last week for his MRI. Can you please make sure that it gets scheduled asap?

## 2019-06-15 NOTE — Telephone Encounter (Signed)
Called patient a few times but no answer and mailbox was full. Hopefully he will see our call and call us back. Letter printed

## 2019-06-16 NOTE — Telephone Encounter (Signed)
Talked to patient and advised him letter will be up front for pick up, he will call radiology today to set up time for MRI

## 2019-07-05 ENCOUNTER — Other Ambulatory Visit: Payer: Self-pay | Admitting: Family

## 2019-07-05 DIAGNOSIS — F411 Generalized anxiety disorder: Secondary | ICD-10-CM

## 2019-08-04 ENCOUNTER — Other Ambulatory Visit: Payer: Self-pay | Admitting: Family

## 2019-08-04 DIAGNOSIS — F411 Generalized anxiety disorder: Secondary | ICD-10-CM

## 2019-11-09 ENCOUNTER — Other Ambulatory Visit: Payer: Self-pay | Admitting: Family

## 2019-11-09 DIAGNOSIS — K219 Gastro-esophageal reflux disease without esophagitis: Secondary | ICD-10-CM

## 2019-11-23 ENCOUNTER — Other Ambulatory Visit: Payer: Self-pay | Admitting: Family

## 2019-11-23 DIAGNOSIS — F411 Generalized anxiety disorder: Secondary | ICD-10-CM

## 2019-12-01 ENCOUNTER — Other Ambulatory Visit: Payer: Self-pay | Admitting: Family

## 2019-12-01 DIAGNOSIS — F411 Generalized anxiety disorder: Secondary | ICD-10-CM

## 2020-06-09 ENCOUNTER — Telehealth: Payer: Self-pay | Admitting: Family

## 2020-06-09 DIAGNOSIS — F411 Generalized anxiety disorder: Secondary | ICD-10-CM

## 2020-06-09 NOTE — Telephone Encounter (Signed)
Pt past due for follow up. Please contact pt to schedule.

## 2020-06-11 NOTE — Telephone Encounter (Signed)
Called patient... no answer no opt. To leave msg

## 2020-06-12 ENCOUNTER — Other Ambulatory Visit: Payer: Self-pay | Admitting: Family

## 2020-06-12 DIAGNOSIS — K219 Gastro-esophageal reflux disease without esophagitis: Secondary | ICD-10-CM

## 2020-07-12 ENCOUNTER — Other Ambulatory Visit: Payer: Self-pay | Admitting: Family

## 2020-07-12 DIAGNOSIS — F411 Generalized anxiety disorder: Secondary | ICD-10-CM

## 2020-07-17 ENCOUNTER — Other Ambulatory Visit: Payer: Self-pay | Admitting: Family

## 2020-07-17 DIAGNOSIS — F411 Generalized anxiety disorder: Secondary | ICD-10-CM

## 2020-07-26 NOTE — Telephone Encounter (Signed)
Called pt again. He stated he had to move providers due to insurance and no longer needed refills.

## 2022-09-13 ENCOUNTER — Encounter (HOSPITAL_BASED_OUTPATIENT_CLINIC_OR_DEPARTMENT_OTHER): Payer: Self-pay | Admitting: Pediatrics

## 2022-09-13 ENCOUNTER — Other Ambulatory Visit: Payer: Self-pay

## 2022-09-13 ENCOUNTER — Emergency Department (HOSPITAL_BASED_OUTPATIENT_CLINIC_OR_DEPARTMENT_OTHER): Payer: BLUE CROSS/BLUE SHIELD

## 2022-09-13 ENCOUNTER — Emergency Department (HOSPITAL_BASED_OUTPATIENT_CLINIC_OR_DEPARTMENT_OTHER)
Admission: EM | Admit: 2022-09-13 | Discharge: 2022-09-13 | Discharge status: Against Medical Advice | Payer: BLUE CROSS/BLUE SHIELD | Attending: Emergency Medicine | Admitting: Emergency Medicine

## 2022-09-13 DIAGNOSIS — R519 Headache, unspecified: Secondary | ICD-10-CM | POA: Diagnosis present

## 2022-09-13 DIAGNOSIS — R1013 Epigastric pain: Secondary | ICD-10-CM | POA: Insufficient documentation

## 2022-09-13 DIAGNOSIS — R200 Anesthesia of skin: Secondary | ICD-10-CM | POA: Insufficient documentation

## 2022-09-13 DIAGNOSIS — R072 Precordial pain: Secondary | ICD-10-CM | POA: Insufficient documentation

## 2022-09-13 DIAGNOSIS — Y92410 Unspecified street and highway as the place of occurrence of the external cause: Secondary | ICD-10-CM | POA: Diagnosis not present

## 2022-09-13 DIAGNOSIS — Z5321 Procedure and treatment not carried out due to patient leaving prior to being seen by health care provider: Secondary | ICD-10-CM | POA: Insufficient documentation

## 2022-09-13 LAB — CBC
HCT: 42 % (ref 39.0–52.0)
Hemoglobin: 15.1 g/dL (ref 13.0–17.0)
MCH: 33.8 pg (ref 26.0–34.0)
MCHC: 36 g/dL (ref 30.0–36.0)
MCV: 94 fL (ref 80.0–100.0)
Platelets: 308 10*3/uL (ref 150–400)
RBC: 4.47 MIL/uL (ref 4.22–5.81)
RDW: 12.8 % (ref 11.5–15.5)
WBC: 7.1 10*3/uL (ref 4.0–10.5)
nRBC: 0 % (ref 0.0–0.2)

## 2022-09-13 LAB — BASIC METABOLIC PANEL
Anion gap: 10 (ref 5–15)
BUN: 18 mg/dL (ref 6–20)
CO2: 24 mmol/L (ref 22–32)
Calcium: 9.4 mg/dL (ref 8.9–10.3)
Chloride: 106 mmol/L (ref 98–111)
Creatinine, Ser: 1.06 mg/dL (ref 0.61–1.24)
GFR, Estimated: >60 mL/min (ref >60)
Glucose, Bld: 105 mg/dL — ABNORMAL HIGH (ref 70–99)
Potassium: 3.9 mmol/L (ref 3.5–5.1)
Sodium: 140 mmol/L (ref 135–145)

## 2022-09-13 LAB — TROPONIN I (HIGH SENSITIVITY): Troponin I (High Sensitivity): 3 ng/L (ref <18)

## 2022-09-13 MED ORDER — ACETAMINOPHEN 325 MG PO TABS
650.0000 mg | ORAL_TABLET | Freq: Once | ORAL | Status: AC
  Administered 2022-09-13: 650 mg via ORAL
  Filled 2022-09-13: qty 2
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
## 2022-09-16 ENCOUNTER — Other Ambulatory Visit: Payer: Self-pay

## 2022-09-16 ENCOUNTER — Emergency Department (HOSPITAL_BASED_OUTPATIENT_CLINIC_OR_DEPARTMENT_OTHER)
Admission: EM | Admit: 2022-09-16 | Discharge: 2022-09-16 | Disposition: A | Discharge status: Home / Self Care | Payer: BLUE CROSS/BLUE SHIELD | Attending: Emergency Medicine | Admitting: Emergency Medicine

## 2022-09-16 DIAGNOSIS — R519 Headache, unspecified: Secondary | ICD-10-CM | POA: Diagnosis not present

## 2022-09-16 DIAGNOSIS — M549 Dorsalgia, unspecified: Secondary | ICD-10-CM

## 2022-09-16 DIAGNOSIS — M5412 Radiculopathy, cervical region: Secondary | ICD-10-CM | POA: Diagnosis not present

## 2022-09-16 DIAGNOSIS — M25512 Pain in left shoulder: Secondary | ICD-10-CM | POA: Diagnosis present

## 2022-09-16 DIAGNOSIS — Y92410 Unspecified street and highway as the place of occurrence of the external cause: Secondary | ICD-10-CM | POA: Diagnosis not present

## 2022-09-16 MED ORDER — LIDOCAINE 5 % EX PTCH
1.0000 | MEDICATED_PATCH | Freq: Once | CUTANEOUS | Status: DC
  Administered 2022-09-16: 1 via TRANSDERMAL
  Filled 2022-09-16: qty 1

## 2022-09-16 MED ORDER — DEXAMETHASONE SODIUM PHOSPHATE 10 MG/ML IJ SOLN
10.0000 mg | Freq: Once | INTRAMUSCULAR | Status: AC
  Administered 2022-09-16: 10 mg via INTRAMUSCULAR
  Filled 2022-09-16: qty 1

## 2022-09-16 MED ORDER — LIDOCAINE 5 % EX PTCH: 1.0000 | MEDICATED_PATCH | CUTANEOUS | 0 refills | Status: AC
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
## 2022-09-16 NOTE — ED Notes (Signed)
Reviewed discharge instructions and recommendations for follow up care. Pt is aware of prescription sent to pharmacy. States understanding. Ambulatory for discharge

## 2022-09-16 NOTE — Discharge Instructions (Signed)
Please read and follow all provided instructions.  Your diagnoses today include:  1. Cervical radiculopathy   2. Upper back pain    Tests performed today include: Vital signs - see below for your results today  Medications prescribed:  None  Take any prescribed medications only as directed.  Home care instructions:  Follow any educational materials contained in this packet Please rest, use ice or heat on your back for the next several days Do not lift, push, pull anything more than 10 pounds for the next week  Follow-up instructions: Please follow-up with your primary care provider or neurosurgery referral in the next 1 week for further evaluation of your symptoms.   Return instructions:  SEEK IMMEDIATE MEDICAL ATTENTION IF YOU HAVE: New numbness, tingling, weakness, or problem with the use of your arms or legs Severe back pain not relieved with medications Loss control of your bowels or bladder Increasing pain in any areas of the body (such as chest or abdominal pain) Shortness of breath, dizziness, or fainting.  Worsening nausea (feeling sick to your stomach), vomiting, fever, or sweats Any other emergent concerns regarding your health   Additional Information:  Your vital signs today were: BP (!) 138/96   Pulse 61   Temp 98.1 F (36.7 C) (Oral)   Resp 15   Ht 6\' 2"  (1.88 m)   Wt 113.4 kg   SpO2 100%   BMI 32.10 kg/m  If your blood pressure (BP) was elevated above 135/85 this visit, please have this repeated by your doctor within one month. --------------
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
## 2022-09-16 NOTE — ED Notes (Signed)
Pt refused EKG x 2. EDP notified
